# Patient Record
Sex: Female | Born: 1980 | Race: White | Hispanic: No | Marital: Married | Smoking: Never smoker
Health system: Southern US, Community
[De-identification: ages and names within clinical notes are randomized; demographics above are authoritative.]

## PROBLEM LIST (undated history)

## (undated) ENCOUNTER — Inpatient Hospital Stay (HOSPITAL_COMMUNITY): Payer: Self-pay

## (undated) DIAGNOSIS — R7303 Prediabetes: Secondary | ICD-10-CM

## (undated) DIAGNOSIS — F329 Major depressive disorder, single episode, unspecified: Secondary | ICD-10-CM

## (undated) DIAGNOSIS — K760 Fatty (change of) liver, not elsewhere classified: Secondary | ICD-10-CM

## (undated) DIAGNOSIS — R030 Elevated blood-pressure reading, without diagnosis of hypertension: Secondary | ICD-10-CM

## (undated) DIAGNOSIS — I10 Essential (primary) hypertension: Secondary | ICD-10-CM

## (undated) DIAGNOSIS — O24419 Gestational diabetes mellitus in pregnancy, unspecified control: Secondary | ICD-10-CM

## (undated) DIAGNOSIS — R12 Heartburn: Secondary | ICD-10-CM

## (undated) DIAGNOSIS — R0602 Shortness of breath: Secondary | ICD-10-CM

## (undated) DIAGNOSIS — F32A Depression, unspecified: Secondary | ICD-10-CM

## (undated) DIAGNOSIS — F419 Anxiety disorder, unspecified: Secondary | ICD-10-CM

## (undated) HISTORY — DX: Major depressive disorder, single episode, unspecified: F32.9

## (undated) HISTORY — DX: Shortness of breath: R06.02

## (undated) HISTORY — PX: WISDOM TOOTH EXTRACTION: SHX21

## (undated) HISTORY — DX: Elevated blood-pressure reading, without diagnosis of hypertension: R03.0

## (undated) HISTORY — DX: Essential (primary) hypertension: I10

## (undated) HISTORY — DX: Anxiety disorder, unspecified: F41.9

## (undated) HISTORY — DX: Fatty (change of) liver, not elsewhere classified: K76.0

## (undated) HISTORY — DX: Gestational diabetes mellitus in pregnancy, unspecified control: O24.419

## (undated) HISTORY — DX: Depression, unspecified: F32.A

## (undated) HISTORY — DX: Heartburn: R12

## (undated) HISTORY — DX: Prediabetes: R73.03

---

## 2004-01-09 ENCOUNTER — Other Ambulatory Visit: Admission: RE | Admit: 2004-01-09 | Discharge: 2004-01-09 | Payer: Self-pay | Admitting: Obstetrics and Gynecology

## 2005-01-23 ENCOUNTER — Other Ambulatory Visit: Admission: RE | Admit: 2005-01-23 | Discharge: 2005-01-23 | Payer: Self-pay | Admitting: Obstetrics and Gynecology

## 2005-04-18 ENCOUNTER — Emergency Department (HOSPITAL_COMMUNITY): Admission: EM | Admit: 2005-04-18 | Discharge: 2005-04-18 | Payer: Self-pay | Admitting: Emergency Medicine

## 2011-03-25 ENCOUNTER — Encounter (HOSPITAL_COMMUNITY): Payer: Self-pay | Admitting: *Deleted

## 2011-03-25 ENCOUNTER — Inpatient Hospital Stay (HOSPITAL_COMMUNITY)
Admission: AD | Admit: 2011-03-25 | Discharge: 2011-03-28 | DRG: 775 | Disposition: A | Discharge status: Home / Self Care | Payer: 59 | Source: Ambulatory Visit | Attending: Obstetrics & Gynecology | Admitting: Obstetrics & Gynecology

## 2011-03-25 DIAGNOSIS — Z22330 Carrier of Group B streptococcus: Secondary | ICD-10-CM

## 2011-03-25 DIAGNOSIS — O99892 Other specified diseases and conditions complicating childbirth: Secondary | ICD-10-CM | POA: Diagnosis present

## 2011-03-25 LAB — CBC
HCT: 33.9 % — ABNORMAL LOW (ref 36.0–46.0)
Hemoglobin: 11.3 g/dL — ABNORMAL LOW (ref 12.0–15.0)
MCH: 25.5 pg — ABNORMAL LOW (ref 26.0–34.0)
MCHC: 33.3 g/dL (ref 30.0–36.0)

## 2011-03-26 LAB — CBC
HCT: 32.3 % — ABNORMAL LOW (ref 36.0–46.0)
Hemoglobin: 10.6 g/dL — ABNORMAL LOW (ref 12.0–15.0)
MCHC: 32.8 g/dL (ref 30.0–36.0)
MCV: 76.4 fL — ABNORMAL LOW (ref 78.0–100.0)
RDW: 14.6 % (ref 11.5–15.5)

## 2011-03-26 LAB — COMPREHENSIVE METABOLIC PANEL
Albumin: 2.3 g/dL — ABNORMAL LOW (ref 3.5–5.2)
BUN: 6 mg/dL (ref 6–23)
Chloride: 101 mEq/L (ref 96–112)
Creatinine, Ser: 0.61 mg/dL (ref 0.50–1.10)
GFR calc Af Amer: >60 mL/min (ref >60)
Glucose, Bld: 84 mg/dL (ref 70–99)
Total Bilirubin: 0.2 mg/dL — ABNORMAL LOW (ref 0.3–1.2)
Total Protein: 5.5 g/dL — ABNORMAL LOW (ref 6.0–8.3)

## 2011-03-26 LAB — URIC ACID: Uric Acid, Serum: 4.9 mg/dL (ref 2.4–7.0)

## 2011-03-26 LAB — LACTATE DEHYDROGENASE: LDH: 134 U/L (ref 94–250)

## 2011-03-27 LAB — CBC
MCH: 25.4 pg — ABNORMAL LOW (ref 26.0–34.0)
MCV: 76.9 fL — ABNORMAL LOW (ref 78.0–100.0)
Platelets: 196 10*3/uL (ref 150–400)
RBC: 3.89 MIL/uL (ref 3.87–5.11)
RDW: 14.8 % (ref 11.5–15.5)

## 2011-03-28 LAB — RH IMMUNE GLOB WKUP(>/=20WKS)(NOT WOMEN'S HOSP)

## 2011-03-30 ENCOUNTER — Inpatient Hospital Stay (HOSPITAL_COMMUNITY): Admission: AD | Admit: 2011-03-30 | Payer: Self-pay | Source: Ambulatory Visit | Admitting: Obstetrics and Gynecology

## 2013-03-23 ENCOUNTER — Ambulatory Visit: Payer: Self-pay | Admitting: Family Medicine

## 2013-11-25 ENCOUNTER — Ambulatory Visit (INDEPENDENT_AMBULATORY_CARE_PROVIDER_SITE_OTHER): Payer: BC Managed Care – PPO | Admitting: Family Medicine

## 2013-11-25 ENCOUNTER — Encounter: Payer: Self-pay | Admitting: Family Medicine

## 2013-11-25 VITALS — BP 118/78 | HR 115 | Temp 98.5°F | Ht 67.5 in | Wt 247.2 lb

## 2013-11-25 DIAGNOSIS — R112 Nausea with vomiting, unspecified: Secondary | ICD-10-CM

## 2013-11-25 DIAGNOSIS — J069 Acute upper respiratory infection, unspecified: Secondary | ICD-10-CM

## 2013-11-25 MED ORDER — HYDROCOD POLST-CHLORPHEN POLST 10-8 MG/5ML PO LQCR: 5.0000 mL | Freq: Every evening | ORAL | Status: DC | PRN

## 2013-11-25 NOTE — Assessment & Plan Note (Signed)
Likely related to URI. No further tx needed.

## 2013-11-25 NOTE — Patient Instructions (Signed)
This is likely a virus. Keep taking Mucinex, drink plenty of fluids.  Try tussionex as needed for cough at night- it will make you sleepy.   Treat sympotmatically with Mucinex, nasal saline irrigation, and Tylenol/Ibuprofen.     Call if not improving as expected in 5-7 days.

## 2013-11-25 NOTE — Assessment & Plan Note (Signed)
Likely viral. Exam benign and reassuring- advised supportive care. Rx given for tussionex prn cough at night. Call or return to clinic prn if these symptoms worsen or fail to improve as anticipated.

## 2013-11-25 NOTE — Progress Notes (Signed)
Pre visit review using our clinic review tool, if applicable. No additional management support is needed unless otherwise documented below in the visit note. 

## 2013-11-25 NOTE — Progress Notes (Signed)
   Subjective:   Patient ID: Misty Brown, female    DOB: 03/11/1981, 33 y.o.   MRN: [id number]  Misty Brown is a pleasant 33 y.o. year old female who has never been seen here,  presents to clinic today with Generalized Body Aches and congestion in chest  on 11/25/2013  HPI: Since yesterday, productive cough, body aches, no fever. Nausea, gags when she coughs of sputum but no vomiting.  No diarrhea but did have gastroenteritis 2 weeks ago.  No sore throat except when she coughs. No CP.  She is not a smoker.  No known sick contacts.  Patient Active Problem List   Diagnosis Date Noted  . Acute upper respiratory infections of unspecified site 11/25/2013  . Nausea with vomiting 11/25/2013   No past medical history on file. No past surgical history on file. History  Substance Use Topics  . Smoking status: Never Smoker   . Smokeless tobacco: Not on file  . Alcohol Use: No   No family history on file. Allergies  Allergen Reactions  . Dicyclomine     Abdominal pain    No current outpatient prescriptions on file prior to visit.   No current facility-administered medications on file prior to visit.   The PMH, PSH, Social History, Family History, Medications, and allergies [facility], and have been updated if relevant.   Review of Systems  Constitutional: Positive for chills. Negative for fever, activity change, appetite change and unexpected weight change.  HENT: Negative for ear pain, mouth sores, sinus pressure and trouble swallowing.   Respiratory: Positive for cough. Negative for chest tightness, shortness of breath and wheezing.   Cardiovascular: Negative for chest pain, palpitations and leg swelling.  Gastrointestinal: Negative for diarrhea.  All other systems reviewed and are negative.       Objective:    BP 118/78  Pulse 115  Temp(Src) 98.5 F (36.9 C) (Oral)  Ht 5' 7.5" (1.715 m)  Wt 247 lb 4 oz (112.152 kg)  BMI 38.13 kg/m2  SpO2 97%   LMP 11/06/2013  Breastfeeding? Unknown   Physical Exam  Nursing note and vitals reviewed. Constitutional: She appears well-developed and well-nourished. No distress.  HENT:  Head: Normocephalic.  Right Ear: No drainage. Tympanic membrane is not injected and not bulging. Tympanic membrane mobility is normal.  Left Ear: There is drainage. Tympanic membrane is injected. Tympanic membrane is not bulging. Tympanic membrane mobility is normal.  Nose: Rhinorrhea present. No mucosal edema.  Mouth/Throat: No oropharyngeal exudate, posterior oropharyngeal edema, posterior oropharyngeal erythema or tonsillar abscesses.  Cardiovascular: Normal rate and regular rhythm.   Pulmonary/Chest: Effort normal and breath sounds normal.  Skin: Skin is warm, dry and intact. No rash noted.          Assessment & Plan:   Acute upper respiratory infections of unspecified site  Nausea with vomiting No Follow-up on file.

## 2013-12-08 ENCOUNTER — Ambulatory Visit: Payer: 59 | Admitting: Family Medicine

## 2014-01-26 ENCOUNTER — Ambulatory Visit: Payer: 59 | Admitting: Family Medicine

## 2014-02-09 ENCOUNTER — Ambulatory Visit (INDEPENDENT_AMBULATORY_CARE_PROVIDER_SITE_OTHER): Payer: BC Managed Care – PPO | Admitting: Family Medicine

## 2014-02-09 ENCOUNTER — Encounter: Payer: Self-pay | Admitting: Family Medicine

## 2014-02-09 VITALS — BP 126/80 | HR 73 | Temp 98.0°F | Ht 67.75 in | Wt 247.8 lb

## 2014-02-09 DIAGNOSIS — R110 Nausea: Secondary | ICD-10-CM

## 2014-02-09 DIAGNOSIS — O210 Mild hyperemesis gravidarum: Secondary | ICD-10-CM | POA: Insufficient documentation

## 2014-02-09 DIAGNOSIS — R29898 Other symptoms and signs involving the musculoskeletal system: Secondary | ICD-10-CM | POA: Insufficient documentation

## 2014-02-09 DIAGNOSIS — G609 Hereditary and idiopathic neuropathy, unspecified: Secondary | ICD-10-CM | POA: Insufficient documentation

## 2014-02-09 DIAGNOSIS — H538 Other visual disturbances: Secondary | ICD-10-CM

## 2014-02-09 HISTORY — DX: Other symptoms and signs involving the musculoskeletal system: R29.898

## 2014-02-09 LAB — CBC WITH DIFFERENTIAL/PLATELET
BASOS ABS: 0 10*3/uL (ref 0.0–0.1)
Basophils Relative: 0.5 % (ref 0.0–3.0)
EOS ABS: 0.3 10*3/uL (ref 0.0–0.7)
Eosinophils Relative: 3.1 % (ref 0.0–5.0)
HCT: 39.1 % (ref 36.0–46.0)
Hemoglobin: 12.6 g/dL (ref 12.0–15.0)
LYMPHS PCT: 19.1 % (ref 12.0–46.0)
Lymphs Abs: 1.9 10*3/uL (ref 0.7–4.0)
MCHC: 32.3 g/dL (ref 30.0–36.0)
MCV: 78.6 fl (ref 78.0–100.0)
Monocytes Absolute: 0.5 10*3/uL (ref 0.1–1.0)
Monocytes Relative: 5.6 % (ref 3.0–12.0)
NEUTROS PCT: 71.7 % (ref 43.0–77.0)
Neutro Abs: 7 10*3/uL (ref 1.4–7.7)
PLATELETS: 289 10*3/uL (ref 150.0–400.0)
RBC: 4.97 Mil/uL (ref 3.87–5.11)
RDW: 14.1 % (ref 11.5–15.5)
WBC: 9.7 10*3/uL (ref 4.0–10.5)

## 2014-02-09 LAB — COMPREHENSIVE METABOLIC PANEL
ALBUMIN: 3.8 g/dL (ref 3.5–5.2)
ALK PHOS: 62 U/L (ref 39–117)
ALT: 21 U/L (ref 0–35)
AST: 22 U/L (ref 0–37)
BUN: 9 mg/dL (ref 6–23)
CO2: 27 mEq/L (ref 19–32)
Calcium: 9.6 mg/dL (ref 8.4–10.5)
Chloride: 106 mEq/L (ref 96–112)
Creatinine, Ser: 0.7 mg/dL (ref 0.4–1.2)
GFR: 108.24 mL/min (ref >60.00)
Glucose, Bld: 85 mg/dL (ref 70–99)
POTASSIUM: 4.2 meq/L (ref 3.5–5.1)
SODIUM: 138 meq/L (ref 135–145)
TOTAL PROTEIN: 7.6 g/dL (ref 6.0–8.3)
Total Bilirubin: 0.4 mg/dL (ref 0.2–1.2)

## 2014-02-09 LAB — HEMOGLOBIN A1C: Hgb A1c MFr Bld: 5.7 % (ref 4.6–6.5)

## 2014-02-09 LAB — T4, FREE: FREE T4: 0.71 ng/dL (ref 0.60–1.60)

## 2014-02-09 LAB — C-REACTIVE PROTEIN: CRP: 2.3 mg/dL (ref 0.5–20.0)

## 2014-02-09 LAB — TSH: TSH: 1.04 u[IU]/mL (ref 0.35–4.50)

## 2014-02-09 LAB — VITAMIN B12: Vitamin B-12: 234 pg/mL (ref 211–911)

## 2014-02-09 LAB — SEDIMENTATION RATE: SED RATE: 8 mm/h (ref 0–22)

## 2014-02-09 NOTE — Progress Notes (Signed)
Pre visit review using our clinic review tool, if applicable. No additional management support is needed unless otherwise documented below in the visit note. 

## 2014-02-09 NOTE — Assessment & Plan Note (Addendum)
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 If neg, consider brain MRI. Neuro exam reassuring.  I did advise her to make an eye doctors appt. Orders Placed This Encounter  Procedures  . CBC with Differential  . Hemoglobin A1c  . Comprehensive metabolic panel  . TSH  . T4, Free  . Vitamin B12  . Vitamin D, 25-hydroxy  . Sedimentation Rate  . C-reactive protein  . Myasthenia gravis panel 2

## 2014-02-09 NOTE — Progress Notes (Signed)
   Subjective:   Patient ID: Misty MamChristy Hamelin, female    DOB: 09/28/1981, 33 y.o.   MRN: [id number]  Misty Brown is a pleasant 33 y.o. year old female who presents to clinic today with Establish Care and Extremity Weakness  on 02/09/2014  HPI: Intermittent episodes for over a year of intermittent LE weakness and tingling.  Often associated with nausea, no vomiting.  Not usually presyncopal but does feel hot. Symptoms usually resolve within 30 minutes.  Something to eat usually helps although fasting (like for labs) does not seem to bring about these attacks.  Denies faituge. Denies HA. +intermitent blurred vision, not like tunnel vision or double vision- this is constant but only started after these other symptoms started.   All symptoms worse at night.    +FH of DM but not first degree relatives.  Patient Active Problem List   Diagnosis Date Noted  . Lower extremity weakness 02/09/2014  . Nauseated 02/09/2014   History reviewed. No pertinent past medical history. Past Surgical History  Procedure Laterality Date  . Wisdom tooth extraction     History  Substance Use Topics  . Smoking status: Never Smoker   . Smokeless tobacco: Never Used  . Alcohol Use: No   Family History  Problem Relation Age of Onset  . Hypertension Mother   . Heart disease Maternal Grandmother   . Heart disease Maternal Grandfather   . Stroke Paternal Grandmother    Allergies  Allergen Reactions  . Dicyclomine     Abdominal pain    Current Outpatient Prescriptions on File Prior to Visit  Medication Sig Dispense Refill  . norgestimate-ethinyl estradiol (ORTHO-CYCLEN,SPRINTEC,PREVIFEM) 0.25-35 MG-MCG tablet Take 1 tablet by mouth daily.       No current facility-administered medications on file prior to visit.   The PMH, PSH, Social History, Family History, Medications, and allergies [facility], and have been updated if relevant.  Review of Systems    See HPI No UE weakness or  tingling No CP or SOB No HA No increased frequency of urination No back pain  Objective:    BP 126/80  Pulse 73  Temp(Src) 98 F (36.7 C) (Oral)  Ht 5' 7.75" (1.721 m)  Wt 247 lb 12 oz (112.379 kg)  BMI 37.94 kg/m2  SpO2 97%  LMP 01/13/2014  Breastfeeding? No   Physical Exam  Nursing note and vitals reviewed. Constitutional: She appears well-developed and well-nourished. No distress.  HENT:  Head: Normocephalic and atraumatic.  Eyes: Pupils are equal, round, and reactive to light.  Neck: Normal range of motion.  Cardiovascular: Normal rate and regular rhythm.   Abdominal: Soft. Bowel sounds are normal.  Neurological: She has normal strength and normal reflexes. No cranial nerve deficit or sensory deficit. She displays a negative Romberg sign.  Skin: Skin is warm and dry.  Psychiatric: She has a normal mood and affect. Her behavior is normal. Judgment and thought content normal.          Assessment & Plan:   Lower extremity weakness - Plan: CBC with Differential, Hemoglobin A1c, Comprehensive metabolic panel, TSH, T4, Free, Vitamin B12, Vitamin D, 25-hydroxy, Sedimentation Rate, C-reactive protein  Nauseated - Plan: CBC with Differential, Hemoglobin A1c No Follow-up on file.

## 2014-02-09 NOTE — Patient Instructions (Signed)
Good to see you. I will call you with your lab results.  Please make an appointment with an eye doctor.

## 2014-02-10 LAB — VITAMIN D 25 HYDROXY (VIT D DEFICIENCY, FRACTURES): Vit D, 25-Hydroxy: 30 ng/mL (ref 30–89)

## 2014-02-16 LAB — MYASTHENIA GRAVIS PANEL 2
ACETYLCHOLINE REC MOD AB: 3 %
Acetylcholine Rec Binding: <0.3 nmol/L
Aceytlcholine Rec Bloc Ab: <15 % of inhibition (ref <15)

## 2014-04-07 ENCOUNTER — Telehealth: Payer: Self-pay

## 2014-04-07 NOTE — Telephone Encounter (Signed)
Pt started on oral OTC B 12 in May 2015 and pt said it made her feel tired and irritable and had stomach pain. Pt stopped med and pt felt fine, no fatigue and no irritabilty. Pt wants to know if she should try B 12 injectable. [facility] Pt request cb. Please see result note 02/17/14.

## 2014-04-10 NOTE — Telephone Encounter (Signed)
Per Rose (front office) the medication is $5 and the administration fee is $60. Patient notified as instructed by telephone. Was advised by patient that she does not want to take the injections at this time, but will call back if she changes her mind.

## 2014-04-10 NOTE — Telephone Encounter (Signed)
Yes I would suggest IM B12 injections but it is not absolutely necessary.  B12 low end of normal and I felt she may feel less fatigued if we replete it.  I cannot comment on cost.  Please ask Carlena SaxBlair about cost of this injection.

## 2014-07-31 ENCOUNTER — Encounter: Payer: Self-pay | Admitting: Family Medicine

## 2014-09-29 NOTE — L&D Delivery Note (Signed)
Delivery Note At 4:27 PM a viable and healthy female was delivered via Vaginal, Spontaneous Delivery (Presentation:Right ; Occiput Anterior).  APGAR: 9, 9; weight pending .   Placenta status: Intact, Spontaneous.  Cord: 3 vessels with a loose nuchal cord x 1 reduced on the perineum.   Anesthesia: Epidural  Episiotomy: None Lacerations: 1st degree;Vaginal Suture Repair: 3.0 vicryl Est. Blood Loss (mL): 190  Mom to postpartum.  Baby to Couplet care / Skin to Skin.  Misty Mateo H. 07/23/2015, 4:53 PM

## 2014-11-29 ENCOUNTER — Telehealth: Payer: Self-pay | Admitting: Family Medicine

## 2014-11-29 NOTE — Telephone Encounter (Signed)
Pt has appt with Dr Reece AgarG on 12/01/14 at 3:45pm.

## 2014-11-29 NOTE — Telephone Encounter (Signed)
[facility] - Client TELEPHONE ADVICE RECORD [facility] Patient Name: Misty Brown DOB: 01/10/1981 Initial Comment Caller states her ankles have been swelling up for the past 3 days. Caller states she does have an appt on Friday but wants to know what she can do until then Nurse Assessment Nurse: Apolinar JunesBrandon, RN, Darl PikesSusan Date/Time Lamount Cohen(Eastern Time): 11/29/2014 12:39:04 PM Confirm and document reason for call. If symptomatic, describe symptoms. ---Caller states her ankles have been swelling up for the past 3 days. Caller states she does have an appt on Friday but wants to know what she can do until then - not severe but noticeable , her sock will leave indention - she had it during her pregnancy - it does get better when she gets home and is able to elevate her feet and legs - when she waken in the morning they are fine - she does drink a lot of soft drinks and it trying to decrease drinking them and it drinking a lot of crystal light and she is urinating a lot more , but she does not drink much water at all - states there is a possibility she may be pregnant she just realized she has not started her period and she should have a week ago Has the patient traveled out of the country within the last 30 days? ---Not Applicable Does the patient require triage? ---Yes Related visit to physician within the last 2 weeks? ---No Does the PT have any chronic conditions? (i.e. diabetes, asthma, etc.) ---No Did the patient indicate they were pregnant? ---No Guidelines Guideline Title Affirmed Question Affirmed Notes Leg Swelling and Edema [1] MILD swelling of both ankles (i.e., pedal edema) AND [2] new onset or worsening Final Disposition User See PCP When Office is Open (within 3 days) Apolinar JunesBrandon, Charity fundraiserN, Darl PikesSusan Comments caller states she already has an appointment scheduled for Friday December 01, 2014 at 3:30 Advised caller if there is a possibility she may be  pregnant she can go ahead and do over the counter pregnancy test and then if needed blood work can possibly be done at office if needed - advised her to speak with her PCP about that

## 2014-12-01 ENCOUNTER — Ambulatory Visit: Payer: Self-pay | Admitting: Family Medicine

## 2014-12-28 LAB — OB RESULTS CONSOLE RUBELLA ANTIBODY, IGM: RUBELLA: IMMUNE

## 2014-12-28 LAB — OB RESULTS CONSOLE GC/CHLAMYDIA
CHLAMYDIA, DNA PROBE: NEGATIVE
Gonorrhea: NEGATIVE

## 2014-12-28 LAB — OB RESULTS CONSOLE HEPATITIS B SURFACE ANTIGEN: HEP B S AG: NEGATIVE

## 2014-12-28 LAB — OB RESULTS CONSOLE HIV ANTIBODY (ROUTINE TESTING): HIV: NONREACTIVE

## 2014-12-28 LAB — OB RESULTS CONSOLE RPR: RPR: NONREACTIVE

## 2015-01-17 ENCOUNTER — Other Ambulatory Visit (HOSPITAL_COMMUNITY): Payer: Self-pay | Admitting: Obstetrics & Gynecology

## 2015-01-17 DIAGNOSIS — Z3A18 18 weeks gestation of pregnancy: Secondary | ICD-10-CM

## 2015-01-17 DIAGNOSIS — O289 Unspecified abnormal findings on antenatal screening of mother: Secondary | ICD-10-CM

## 2015-01-17 DIAGNOSIS — Z3689 Encounter for other specified antenatal screening: Secondary | ICD-10-CM

## 2015-02-22 ENCOUNTER — Other Ambulatory Visit (HOSPITAL_COMMUNITY): Payer: Self-pay | Admitting: Obstetrics & Gynecology

## 2015-02-22 ENCOUNTER — Ambulatory Visit (HOSPITAL_COMMUNITY)
Admission: RE | Admit: 2015-02-22 | Discharge: 2015-02-22 | Disposition: A | Discharge status: Home / Self Care | Payer: BLUE CROSS/BLUE SHIELD | Source: Ambulatory Visit | Attending: Obstetrics & Gynecology | Admitting: Obstetrics & Gynecology

## 2015-02-22 ENCOUNTER — Other Ambulatory Visit (HOSPITAL_COMMUNITY): Payer: Self-pay | Admitting: Obstetrics and Gynecology

## 2015-02-22 ENCOUNTER — Encounter (HOSPITAL_COMMUNITY): Payer: Self-pay

## 2015-02-22 DIAGNOSIS — Z3A18 18 weeks gestation of pregnancy: Secondary | ICD-10-CM | POA: Diagnosis not present

## 2015-02-22 DIAGNOSIS — O358XX0 Maternal care for other (suspected) fetal abnormality and damage, not applicable or unspecified: Secondary | ICD-10-CM

## 2015-02-22 DIAGNOSIS — O285 Abnormal chromosomal and genetic finding on antenatal screening of mother: Secondary | ICD-10-CM | POA: Insufficient documentation

## 2015-02-22 DIAGNOSIS — Z36 Encounter for antenatal screening of mother: Secondary | ICD-10-CM | POA: Diagnosis not present

## 2015-02-22 DIAGNOSIS — O280 Abnormal hematological finding on antenatal screening of mother: Secondary | ICD-10-CM | POA: Insufficient documentation

## 2015-02-22 DIAGNOSIS — O289 Unspecified abnormal findings on antenatal screening of mother: Secondary | ICD-10-CM

## 2015-02-22 DIAGNOSIS — Z3689 Encounter for other specified antenatal screening: Secondary | ICD-10-CM

## 2015-02-22 DIAGNOSIS — O35DXX0 Maternal care for other (suspected) fetal abnormality and damage, fetal gastrointestinal anomalies, not applicable or unspecified: Secondary | ICD-10-CM

## 2015-02-22 NOTE — Progress Notes (Signed)
Genetic Counseling  High-Risk Gestation Note  Appointment Date:  02/22/2015 Referred By: Essie Hart, MD Date of Birth:  [birth year] Partner: Misty Brown   Pregnancy History: [id number] Estimated Date of Delivery: 07/26/15 Estimated Gestational Age: [redacted]w[redacted]d Attending: Eulis Foster, MD   [name] and her husband, Mr. Misty Brown, were seen for genetic counseling because of an increased risk for fetal Down syndrome based on first trimester screening.  In summary:  Discussed 1:224 Down syndrome risk from First screen  Ultrasound today visualized echogenic bowel and increased nuchal fold, increasing risk for Down syndrome to approximately 1 in 22  Patient elected to proceed with NIPS (Panorama) and TORCH titers, declined cystic fibrosis carrier screen and amniocentesis  Follow-up ultrasound scheduled for 03/15/15  They were counseled regarding the First trimester screen result and the associated 1 in 224 risk for fetal Down syndrome.  We reviewed chromosomes, nondisjunction, and the common features and variable prognosis of Down syndrome.  In addition, we reviewed the screen adjusted reduction in risks for trisomy 18 (< 1 in 5000).  We also discussed other explanations for a screen positive result including: differences in maternal metabolism and normal variation. They understand that this screening is not diagnostic for Down syndrome but provides a risk assessment.  We reviewed results of detailed ultrasound performed at the time of today's visit. Ultrasound today visualized fetal echogenic bowel and increased nuchal fold measurement (~7 mm). Complete ultrasound results reported separately. The nuchal fold refers to the skin on the back of the fetal neck. A thick nuchal fold measurement is typically defined as greater than 5 mm at 15-[redacted] weeks gestation and greater than 6 mm at 18 through [redacted] weeks gestation. Approximately 1-2% of normal pregnancies will have a thick nuchal fold;  therefore, most babies with this finding are born normal and healthy.  However, the presence of a thick nuchal fold increases the chance for Down syndrome in a pregnancy.   Echogenic bowel was identified at the time of today's ultrasound. The ultrasound report will be sent under separate cover. We discussed that an echogenic bowel refers to an area in the fetal intestines that appears brighter, or denser, than the liver and/or bone on ultrasound. Echogenic bowel is detected in approximately 1% of fetuses at the time of a second trimester ultrasound evaluation.  Most of the time, this brightness does not cause any problems and will spontaneously resolve.  Possible causes of an echogenic bowel include undergrowth of the bowel, an obstruction or blockage of the intestines, the fetus swallowing a small amount of blood present in the amniotic fluid, an intrauterine infection, a chromosome condition, cystic fibrosis, or a normal variation in development.   We discussed that the presence of fetal echogenic bowel and increased nuchal fold would increase the risk for fetal Down syndrome from her screening risk of 1 in 224 to approximately 1 in 22 (4.5%).    [name] reported bleeding early in the first trimester in the pregnancy but no bleeding since that time. She also denied known exposure to infections during the pregnancy. We briefly reviewed cystic fibrosis (CF) including the typical features and prognosis and the autosomal recessive inheritance of the condition. The carrier frequency in the Caucasian population is approximately 1 in 8.  The presence of echogenic bowel on prenatal ultrasound is associated with an increased risk for CF.  They were provided with written information regarding cystic fibrosis (CF) including the carrier frequency and incidence in the Caucasian population, the availability of  carrier testing and prenatal diagnosis if indicated.  In addition, we discussed that CF is routinely screened  for as part of the [place] newborn screening panel.  She declined CF testing today.   We reviewed available screening options including noninvasive prenatal screening (NIPS)/cell free DNA (cfDNA) testing.  They were counseled that screening tests are used to modify a patient's a priori risk for aneuploidy, typically based on age. This estimate provides a pregnancy specific risk assessment. We reviewed the benefits and limitations of each option. Specifically, we discussed the conditions for which each test screens, the detection rates, and false positive rates of each. They were also counseled regarding diagnostic testing via amniocentesis. We reviewed the approximate 1 in 300-500 risk for complications for amniocentesis, including spontaneous pregnancy loss. We discussed the risks, limitations, and benefits of each. After consideration of all the options,  she elected to proceed with cell free DNA testing (Panorama) at the time of today's visit and elected to proceed with maternal TORCH titers. The patient declined amniocentesis at this time. She understands that ultrasound and screening cannot rule out all birth defects or genetic syndromes. A follow-up ultrasound was planned in 3 weeks.   Both family histories were reviewed and found to be noncontributory for birth defects, intellectual disability, and known genetic conditions. Without further information regarding the provided family history, an accurate genetic risk cannot be calculated. Further genetic counseling is warranted if more information is obtained.  [name] denied exposure to environmental toxins or chemical agents. She denied the use of alcohol, tobacco or street drugs. She denied significant viral illnesses during the course of her pregnancy. Her medical and surgical histories were noncontributory.   I counseled this couple for approximately 40 minutes regarding the above risks and available options.   Misty PlowmanKaren Merriel Zinger, MS,   Certified Genetic Counselor 02/22/2015

## 2015-02-23 LAB — TOXOPLASMA ANTIBODIES- IGG AND  IGM: Toxoplasma IgG Ratio: <3 IU/mL (ref 0.0–7.1)

## 2015-02-23 LAB — CMV ANTIBODY, IGG (EIA): CMV Ab - IgG: <0.6 U/mL (ref 0.00–0.59)

## 2015-02-23 LAB — CMV IGM: CMV IgM: <30 AU/mL (ref 0.0–29.9)

## 2015-02-27 ENCOUNTER — Telehealth (HOSPITAL_COMMUNITY): Payer: Self-pay | Admitting: *Deleted

## 2015-02-27 NOTE — Telephone Encounter (Signed)
Called Misty Brown with the results of her CMV and Toxo titers.  These were reviewed with Dr. Claudean SeveranceWhitecar and found to be wnl.  Pt verbalized understanding.  Informed patient that her Panorama had not come back yet, but our genetic counselor will call her as soon as those results are available.

## 2015-03-05 ENCOUNTER — Telehealth (HOSPITAL_COMMUNITY): Payer: Self-pay | Admitting: MS"

## 2015-03-05 ENCOUNTER — Other Ambulatory Visit (HOSPITAL_COMMUNITY): Payer: Self-pay | Admitting: Obstetrics & Gynecology

## 2015-03-05 NOTE — Telephone Encounter (Signed)
Called Misty Brown to discuss her cell free fetal DNA test results.  Mrs. Misty Mamhristy Harpham had [facility]ama testing [facility].  Testing was offered because of increased Down syndrome risk from serum screening and ultrasound findings.   The patient was identified by name and DOB.  We reviewed that these are within normal limits, showing a less than 1 in 10,000 risk for trisomies 21, 18 and 13, and monosomy X (Turner syndrome).  In addition, the risk for triploidy/vanishing twin and sex chromosome trisomies (47,XXX and 47,XXY) was also low risk. We reviewed that this testing identifies > 99% of pregnancies with trisomy 221, trisomy 5213, sex chromosome trisomies (47,XXX and 47,XXY), and triploidy. The detection rate for trisomy 18 is 96%.  The detection rate for monosomy X is ~92%.  The false positive rate is <0.1% for all conditions. Testing was also consistent with female fetal sex.  The patient did wish to know fetal sex.  She understands that this testing does not identify all genetic conditions.  All questions were answered to her satisfaction, she was encouraged to call with additional questions or concerns.  Quinn PlowmanKaren Galo Sayed, MS Certified Genetic Counselor 03/05/2015 9:04 AM

## 2015-03-07 ENCOUNTER — Telehealth: Payer: Self-pay | Admitting: Family Medicine

## 2015-03-07 NOTE — Telephone Encounter (Signed)
Pt has appt scheduled 03/08/15 at 10:30 AM with Nicki Reaperegina Baity NP.

## 2015-03-07 NOTE — Telephone Encounter (Signed)
Patient Name: Misty Brown DOB: 06/07/1981 Initial Comment Caller states she has sinus pressure and has green discharge. Is [redacted] weeks pregnant. Nurse Assessment Nurse: Elijah Birkaldwell, RN, Lynda Date/Time (Eastern Time): 03/07/2015 4:39:46 PM Confirm and document reason for call. If symptomatic, describe symptoms. ---Caller states she has had sinus pressure and has green discharge since May 28th. Has cough, brings up brownish phlegm, sometimes feels SOB. She is [redacted] weeks pregnant. No fever. Has been taking Claritin. Her top teeth hurt as well. Has the patient traveled out of the country within the last 30 days? ---Not Applicable Does the patient require triage? ---Yes Related visit to physician within the last 2 weeks? ---No Does the PT have any chronic conditions? (i.e. diabetes, asthma, etc.) ---No Did the patient indicate they were pregnant? ---Yes How many weeks gestation? ---20 Have you felt decreased fetal movement? ---No Guidelines Guideline Title Affirmed Question Affirmed Notes Sinus Pain or Congestion [1] Sinus congestion (pressure, fullness) AND [2] present > 10 days Final Disposition User See PCP When Office is Open (within 3 days) Elijah Birkaldwell, Charity fundraiserN, [employer]ys[employer]Lynda

## 2015-03-08 ENCOUNTER — Encounter: Payer: Self-pay | Admitting: Internal Medicine

## 2015-03-08 ENCOUNTER — Ambulatory Visit (INDEPENDENT_AMBULATORY_CARE_PROVIDER_SITE_OTHER): Payer: BLUE CROSS/BLUE SHIELD | Admitting: Internal Medicine

## 2015-03-08 VITALS — BP 126/72 | HR 116 | Temp 98.2°F | Wt 236.0 lb

## 2015-03-08 DIAGNOSIS — J0110 Acute frontal sinusitis, unspecified: Secondary | ICD-10-CM | POA: Diagnosis not present

## 2015-03-08 MED ORDER — AMOXICILLIN 500 MG PO CAPS: 500.0000 mg | ORAL_CAPSULE | Freq: Three times a day (TID) | ORAL | Status: DC

## 2015-03-08 NOTE — Progress Notes (Signed)
HPI  Pt presents to the clinic today with c/o facial pain, pressure, nasal congestion and cough. This started 1.5 weeks ago but symptoms seem to be getting worse. She is blowing green mucous out of her nose. The cough is non productive. She feels like she has run low grade fevers. She has also had some pain in her teeth. She has tried Claritin, Flonase and nasal saline rinse without relief. She has not history of allergies or breathing problems. She has not had sick contacts.  Review of Systems    Past Medical History  Diagnosis Date  . Medical history non-contributory     Family History  Problem Relation Age of Onset  . Hypertension Mother   . Heart disease Maternal Grandmother   . Heart disease Maternal Grandfather   . Stroke Paternal Grandmother     History   Social History  . Marital Status: Married    Spouse Name: N/A  . Number of Children: N/A  . Years of Education: N/A   Occupational History  . Not on file.   Social History Main Topics  . Smoking status: Never Smoker   . Smokeless tobacco: Never Used  . Alcohol Use: No  . Drug Use: No  . Sexual Activity: Yes   Other Topics Concern  . Not on file   Social History Narrative    Allergies  Allergen Reactions  . Dicyclomine     Abdominal pain      Constitutional: Positive headache, fatigue and fever. Denies abrupt weight changes.  HEENT:  Positive eye pain, pressure behind the eyes, facial pain, nasal congestion and sore throat. Denies eye redness, ear pain, ringing in the ears, wax buildup, runny nose or bloody nose. Respiratory: Positive cough. Denies difficulty breathing or shortness of breath.  Cardiovascular: Denies chest pain, chest tightness, palpitations or swelling in the hands or feet.   No other specific complaints in a complete review of systems (except as listed in HPI above).  Objective:  BP 126/72 mmHg  Pulse 116  Temp(Src) 98.2 F (36.8 C) (Oral)  Wt 236 lb (107.049 kg)  SpO2  98%   General: Appears her stated age, well developed, well nourished in NAD. HEENT: Head: normal shape and size, frontal sinus tenderness noted; Eyes: sclera white, no icterus, conjunctiva pink; Ears: Tm's gray and intact, normal light reflex; Nose: mucosa boggy and moist, septum midline; Throat/Mouth: + PND. Teeth present, mucosa pink and moist, no exudate noted, no lesions or ulcerations noted.  Neck: No adenopathy noted.  Cardiovascular: Normal rate and rhythm. S1,S2 noted.  No murmur, rubs or gallops noted.  Pulmonary/Chest: Normal effort and positive vesicular breath sounds. No respiratory distress. No wheezes, rales or ronchi noted.      Assessment & Plan:   Acute frontal sinusitis  Can use a Neti Pot which can be purchased from your local drug store. Continue Claritin and Flonase Amoxicillin TID for 10 days (reviewed pregnancy category B, known cleft lip defect, she understands and agrees to the risk)  RTC as needed or if symptoms persist.

## 2015-03-08 NOTE — Progress Notes (Signed)
Pre visit review using our clinic review tool, if applicable. No additional management support is needed unless otherwise documented below in the visit note. 

## 2015-03-08 NOTE — Patient Instructions (Signed)

## 2015-03-15 ENCOUNTER — Encounter (HOSPITAL_COMMUNITY): Payer: Self-pay

## 2015-03-15 ENCOUNTER — Ambulatory Visit (HOSPITAL_COMMUNITY)
Admission: RE | Admit: 2015-03-15 | Discharge: 2015-03-15 | Disposition: A | Discharge status: Home / Self Care | Payer: BLUE CROSS/BLUE SHIELD | Source: Ambulatory Visit | Attending: Obstetrics & Gynecology | Admitting: Obstetrics & Gynecology

## 2015-03-15 DIAGNOSIS — Z3A21 21 weeks gestation of pregnancy: Secondary | ICD-10-CM | POA: Insufficient documentation

## 2015-03-15 DIAGNOSIS — O4402 Placenta previa specified as without hemorrhage, second trimester: Secondary | ICD-10-CM | POA: Diagnosis not present

## 2015-03-15 DIAGNOSIS — Z0489 Encounter for examination and observation for other specified reasons: Secondary | ICD-10-CM | POA: Insufficient documentation

## 2015-03-15 DIAGNOSIS — O289 Unspecified abnormal findings on antenatal screening of mother: Secondary | ICD-10-CM

## 2015-03-15 DIAGNOSIS — Z36 Encounter for antenatal screening of mother: Secondary | ICD-10-CM | POA: Diagnosis not present

## 2015-03-15 DIAGNOSIS — O283 Abnormal ultrasonic finding on antenatal screening of mother: Secondary | ICD-10-CM | POA: Insufficient documentation

## 2015-03-15 DIAGNOSIS — O35DXX0 Maternal care for other (suspected) fetal abnormality and damage, fetal gastrointestinal anomalies, not applicable or unspecified: Secondary | ICD-10-CM | POA: Insufficient documentation

## 2015-03-15 DIAGNOSIS — IMO0002 Reserved for concepts with insufficient information to code with codable children: Secondary | ICD-10-CM | POA: Insufficient documentation

## 2015-03-15 DIAGNOSIS — O99212 Obesity complicating pregnancy, second trimester: Secondary | ICD-10-CM | POA: Insufficient documentation

## 2015-03-15 DIAGNOSIS — O358XX0 Maternal care for other (suspected) fetal abnormality and damage, not applicable or unspecified: Secondary | ICD-10-CM | POA: Diagnosis present

## 2015-05-23 ENCOUNTER — Encounter: Payer: BLUE CROSS/BLUE SHIELD | Attending: Obstetrics and Gynecology

## 2015-05-23 VITALS — Ht 69.0 in | Wt 245.1 lb

## 2015-05-23 DIAGNOSIS — O24419 Gestational diabetes mellitus in pregnancy, unspecified control: Secondary | ICD-10-CM | POA: Diagnosis present

## 2015-05-23 DIAGNOSIS — Z713 Dietary counseling and surveillance: Secondary | ICD-10-CM | POA: Insufficient documentation

## 2015-05-24 NOTE — Progress Notes (Signed)
  Patient was seen on 05/24/15 for Gestational Diabetes self-management . The following learning objectives were met by the patient :   States the definition of Gestational Diabetes  States why dietary management is important in controlling blood glucose  Describes the effects of carbohydrates on blood glucose levels  Demonstrates ability to create a balanced meal plan  Demonstrates carbohydrate counting   States when to check blood glucose levels  Demonstrates proper blood glucose monitoring techniques  States the effect of stress and exercise on blood glucose levels  States the importance of limiting caffeine and abstaining from alcohol and smoking  Plan:  Aim for 2 Carb Choices per meal (30 grams) +/- 1 either way for breakfast Aim for 3 Carb Choices per meal (45 grams) +/- 1 either way from lunch and dinner Aim for 1-2 Carbs per snack Begin reading food labels for Total Carbohydrate and sugar grams of foods Consider  increasing your activity level by walking daily as tolerated Begin checking BG before breakfast and 2 hours after first bit of breakfast, lunch and dinner after  as directed by MD  Take medication  as directed by MD  Blood glucose monitor given: One Touch Verio Flex Lot # [id number] Exp: 07/2016 Blood glucose reading: $RemoveBeforeDE'84mg'BlorjarHxcOodVn$ /dl  Patient instructed to monitor glucose levels: FBS: 60 - <90 2 hour: <120  Patient received the following handouts:  Nutrition Diabetes and Pregnancy  Carbohydrate Counting List  Meal Planning worksheet  Patient will be seen for follow-up as needed.

## 2015-06-29 LAB — OB RESULTS CONSOLE GBS: GBS: NEGATIVE

## 2015-07-23 ENCOUNTER — Inpatient Hospital Stay (HOSPITAL_COMMUNITY): Payer: BLUE CROSS/BLUE SHIELD | Admitting: Anesthesiology

## 2015-07-23 ENCOUNTER — Inpatient Hospital Stay (HOSPITAL_COMMUNITY)
Admission: AD | Admit: 2015-07-23 | Discharge: 2015-07-25 | DRG: 775 | Disposition: A | Discharge status: Home / Self Care | Payer: BLUE CROSS/BLUE SHIELD | Source: Ambulatory Visit | Attending: Obstetrics and Gynecology | Admitting: Obstetrics and Gynecology

## 2015-07-23 ENCOUNTER — Encounter (HOSPITAL_COMMUNITY): Payer: Self-pay | Admitting: *Deleted

## 2015-07-23 DIAGNOSIS — Z8249 Family history of ischemic heart disease and other diseases of the circulatory system: Secondary | ICD-10-CM | POA: Diagnosis not present

## 2015-07-23 DIAGNOSIS — O24410 Gestational diabetes mellitus in pregnancy, diet controlled: Secondary | ICD-10-CM | POA: Diagnosis present

## 2015-07-23 DIAGNOSIS — Z6834 Body mass index (BMI) 34.0-34.9, adult: Secondary | ICD-10-CM | POA: Diagnosis not present

## 2015-07-23 DIAGNOSIS — Z823 Family history of stroke: Secondary | ICD-10-CM

## 2015-07-23 DIAGNOSIS — O99214 Obesity complicating childbirth: Secondary | ICD-10-CM | POA: Diagnosis present

## 2015-07-23 DIAGNOSIS — O24419 Gestational diabetes mellitus in pregnancy, unspecified control: Secondary | ICD-10-CM | POA: Diagnosis present

## 2015-07-23 DIAGNOSIS — E669 Obesity, unspecified: Secondary | ICD-10-CM | POA: Diagnosis present

## 2015-07-23 DIAGNOSIS — Z3A39 39 weeks gestation of pregnancy: Secondary | ICD-10-CM

## 2015-07-23 HISTORY — DX: Gestational diabetes mellitus in pregnancy, unspecified control: O24.419

## 2015-07-23 LAB — CBC
HEMATOCRIT: 34.3 % — AB (ref 36.0–46.0)
HEMOGLOBIN: 11.2 g/dL — AB (ref 12.0–15.0)
MCH: 24.2 pg — AB (ref 26.0–34.0)
MCHC: 32.7 g/dL (ref 30.0–36.0)
MCV: 74.2 fL — AB (ref 78.0–100.0)
Platelets: 218 10*3/uL (ref 150–400)
RBC: 4.62 MIL/uL (ref 3.87–5.11)
RDW: 14.9 % (ref 11.5–15.5)
WBC: 7.8 10*3/uL (ref 4.0–10.5)

## 2015-07-23 LAB — TYPE AND SCREEN
ABO/RH(D): O NEG
Antibody Screen: NEGATIVE

## 2015-07-23 MED ORDER — OXYTOCIN 40 UNITS IN LACTATED RINGERS INFUSION - SIMPLE MED
1.0000 m[IU]/min | INTRAVENOUS | Status: DC
  Administered 2015-07-23: 2 m[IU]/min via INTRAVENOUS
  Filled 2015-07-23: qty 1000

## 2015-07-23 MED ORDER — LIDOCAINE HCL (PF) 1 % IJ SOLN
INTRAMUSCULAR | Status: DC | PRN
  Administered 2015-07-23 (×2): 4 mL

## 2015-07-23 MED ORDER — IBUPROFEN 600 MG PO TABS
600.0000 mg | ORAL_TABLET | Freq: Four times a day (QID) | ORAL | Status: DC
  Administered 2015-07-23 – 2015-07-25 (×6): 600 mg via ORAL
  Filled 2015-07-23 (×7): qty 1

## 2015-07-23 MED ORDER — OXYCODONE-ACETAMINOPHEN 5-325 MG PO TABS
1.0000 | ORAL_TABLET | ORAL | Status: DC | PRN
  Administered 2015-07-24: 1 via ORAL
  Filled 2015-07-23 (×2): qty 1

## 2015-07-23 MED ORDER — METHYLERGONOVINE MALEATE 0.2 MG/ML IJ SOLN: 0.2000 mg | INTRAMUSCULAR | Status: DC | PRN

## 2015-07-23 MED ORDER — BENZOCAINE-MENTHOL 20-0.5 % EX AERO
1.0000 | INHALATION_SPRAY | CUTANEOUS | Status: DC | PRN
Start: 2015-07-23 — End: 2015-07-25
  Administered 2015-07-24: 1 via TOPICAL
  Filled 2015-07-23: qty 56

## 2015-07-23 MED ORDER — DIPHENHYDRAMINE HCL 25 MG PO CAPS: 25.0000 mg | ORAL_CAPSULE | Freq: Four times a day (QID) | ORAL | Status: DC | PRN

## 2015-07-23 MED ORDER — LIDOCAINE HCL (PF) 1 % IJ SOLN
30.0000 mL | INTRAMUSCULAR | Status: DC | PRN
  Filled 2015-07-23: qty 30

## 2015-07-23 MED ORDER — DIPHENHYDRAMINE HCL 50 MG/ML IJ SOLN: 12.5000 mg | INTRAMUSCULAR | Status: DC | PRN

## 2015-07-23 MED ORDER — LACTATED RINGERS IV SOLN
INTRAVENOUS | Status: DC
  Administered 2015-07-23 (×2): via INTRAVENOUS

## 2015-07-23 MED ORDER — ACETAMINOPHEN 325 MG PO TABS
650.0000 mg | ORAL_TABLET | ORAL | Status: DC | PRN
Start: 2015-07-23 — End: 2015-07-25
  Administered 2015-07-24 (×3): 650 mg via ORAL
  Filled 2015-07-23 (×3): qty 2

## 2015-07-23 MED ORDER — OXYTOCIN 40 UNITS IN LACTATED RINGERS INFUSION - SIMPLE MED
62.5000 mL/h | INTRAVENOUS | Status: DC
  Administered 2015-07-23: 62.5 mL/h via INTRAVENOUS

## 2015-07-23 MED ORDER — ACETAMINOPHEN 325 MG PO TABS: 650.0000 mg | ORAL_TABLET | ORAL | Status: DC | PRN

## 2015-07-23 MED ORDER — TETANUS-DIPHTH-ACELL PERTUSSIS 5-2.5-18.5 LF-MCG/0.5 IM SUSP: 0.5000 mL | Freq: Once | INTRAMUSCULAR | Status: DC

## 2015-07-23 MED ORDER — OXYCODONE-ACETAMINOPHEN 5-325 MG PO TABS: 1.0000 | ORAL_TABLET | ORAL | Status: DC | PRN

## 2015-07-23 MED ORDER — DIBUCAINE 1 % RE OINT
1.0000 | TOPICAL_OINTMENT | RECTAL | Status: DC | PRN
Start: 2015-07-23 — End: 2015-07-25

## 2015-07-23 MED ORDER — METHYLERGONOVINE MALEATE 0.2 MG PO TABS: 0.2000 mg | ORAL_TABLET | ORAL | Status: DC | PRN

## 2015-07-23 MED ORDER — OXYCODONE-ACETAMINOPHEN 5-325 MG PO TABS: 2.0000 | ORAL_TABLET | ORAL | Status: DC | PRN

## 2015-07-23 MED ORDER — WITCH HAZEL-GLYCERIN EX PADS: 1.0000 "application " | MEDICATED_PAD | CUTANEOUS | Status: DC | PRN

## 2015-07-23 MED ORDER — TERBUTALINE SULFATE 1 MG/ML IJ SOLN
0.2500 mg | Freq: Once | INTRAMUSCULAR | Status: DC | PRN
  Filled 2015-07-23: qty 1

## 2015-07-23 MED ORDER — EPHEDRINE 5 MG/ML INJ
10.0000 mg | INTRAVENOUS | Status: DC | PRN
  Filled 2015-07-23: qty 2

## 2015-07-23 MED ORDER — CITRIC ACID-SODIUM CITRATE 334-500 MG/5ML PO SOLN
30.0000 mL | ORAL | Status: DC | PRN
Start: 2015-07-23 — End: 2015-07-23

## 2015-07-23 MED ORDER — PHENYLEPHRINE 40 MCG/ML (10ML) SYRINGE FOR IV PUSH (FOR BLOOD PRESSURE SUPPORT)
80.0000 ug | PREFILLED_SYRINGE | INTRAVENOUS | Status: DC | PRN
  Filled 2015-07-23: qty 2
  Filled 2015-07-23: qty 20

## 2015-07-23 MED ORDER — ZOLPIDEM TARTRATE 5 MG PO TABS: 5.0000 mg | ORAL_TABLET | Freq: Every evening | ORAL | Status: DC | PRN

## 2015-07-23 MED ORDER — PRENATAL MULTIVITAMIN CH
1.0000 | ORAL_TABLET | Freq: Every day | ORAL | Status: DC
  Administered 2015-07-24: 1 via ORAL
  Filled 2015-07-23: qty 1

## 2015-07-23 MED ORDER — LACTATED RINGERS IV SOLN: 500.0000 mL | INTRAVENOUS | Status: DC | PRN

## 2015-07-23 MED ORDER — LANOLIN HYDROUS EX OINT: TOPICAL_OINTMENT | CUTANEOUS | Status: DC | PRN

## 2015-07-23 MED ORDER — SENNOSIDES-DOCUSATE SODIUM 8.6-50 MG PO TABS
2.0000 | ORAL_TABLET | ORAL | Status: DC
  Administered 2015-07-23 – 2015-07-25 (×2): 2 via ORAL
  Filled 2015-07-23 (×2): qty 2

## 2015-07-23 MED ORDER — ONDANSETRON HCL 4 MG PO TABS: 4.0000 mg | ORAL_TABLET | ORAL | Status: DC | PRN

## 2015-07-23 MED ORDER — ONDANSETRON HCL 4 MG/2ML IJ SOLN: 4.0000 mg | INTRAMUSCULAR | Status: DC | PRN

## 2015-07-23 MED ORDER — OXYTOCIN BOLUS FROM INFUSION: 500.0000 mL | INTRAVENOUS | Status: DC

## 2015-07-23 MED ORDER — ONDANSETRON HCL 4 MG/2ML IJ SOLN: 4.0000 mg | Freq: Four times a day (QID) | INTRAMUSCULAR | Status: DC | PRN

## 2015-07-23 MED ORDER — SIMETHICONE 80 MG PO CHEW: 80.0000 mg | CHEWABLE_TABLET | ORAL | Status: DC | PRN

## 2015-07-23 MED ORDER — FENTANYL 2.5 MCG/ML BUPIVACAINE 1/10 % EPIDURAL INFUSION (WH - ANES)
14.0000 mL/h | INTRAMUSCULAR | Status: DC | PRN
  Administered 2015-07-23 (×2): 14 mL/h via EPIDURAL
  Filled 2015-07-23 (×2): qty 125

## 2015-07-23 MED ORDER — BUTORPHANOL TARTRATE 1 MG/ML IJ SOLN: 1.0000 mg | INTRAMUSCULAR | Status: DC | PRN

## 2015-07-23 NOTE — Anesthesia Procedure Notes (Signed)
Epidural Patient location during procedure: OB  Staffing Anesthesiologist: Carlyle Mcelrath Performed by: anesthesiologist   Preanesthetic Checklist Completed: patient identified, site marked, surgical consent, pre-op evaluation, timeout performed, IV checked, risks and benefits discussed and monitors and equipment checked  Epidural Patient position: sitting Prep: site prepped and draped and DuraPrep Patient monitoring: continuous pulse ox and blood pressure Approach: midline Location: L3-L4 Injection technique: LOR saline  Needle:  Needle type: Tuohy  Needle gauge: 17 G Needle length: 9 cm and 9 Needle insertion depth: 6 cm Catheter type: closed end flexible Catheter size: 19 Gauge Catheter at skin depth: 10 cm Test dose: negative  Assessment Events: blood not aspirated, injection not painful, no injection resistance, negative IV test and no paresthesia  Additional Notes Patient identified. Risks/Benefits/Options discussed with patient including but not limited to bleeding, infection, nerve damage, paralysis, failed block, incomplete pain control, headache, blood pressure changes, nausea, vomiting, reactions to medication both or allergic, itching and postpartum back pain. Confirmed with bedside nurse the patient's most recent platelet count. Confirmed with patient that they are not currently taking any anticoagulation, have any bleeding history or any family history of bleeding disorders. Patient expressed understanding and wished to proceed. All questions were answered. Sterile technique was used throughout the entire procedure. Please see nursing notes for vital signs. Test dose was given through epidural catheter and negative prior to continuing to dose epidural or start infusion. Warning signs of high block given to the patient including shortness of breath, tingling/numbness in hands, complete motor block, or any concerning symptoms with instructions to call for help. Patient was  given instructions on fall risk and not to get out of bed. All questions and concerns addressed with instructions to call with any issues or inadequate analgesia.    

## 2015-07-23 NOTE — Anesthesia Preprocedure Evaluation (Signed)
Anesthesia Evaluation  Patient identified by MRN, date of birth, ID band Patient awake    Reviewed: Allergy & Precautions, NPO status , Patient's Chart, lab work & pertinent test results  History of Anesthesia Complications Negative for: history of anesthetic complications  Airway Mallampati: II  TM Distance: >3 FB Neck ROM: Full    Dental no notable dental hx. (+) Dental Advisory Given   Pulmonary neg pulmonary ROS,    Pulmonary exam normal breath sounds clear to auscultation       Cardiovascular negative cardio ROS Normal cardiovascular exam Rhythm:Regular Rate:Normal     Neuro/Psych negative neurological ROS  negative psych ROS   GI/Hepatic negative GI ROS, Neg liver ROS,   Endo/Other  diabetes, Gestationalobesity  Renal/GU negative Renal ROS  negative genitourinary   Musculoskeletal negative musculoskeletal ROS (+)   Abdominal   Peds negative pediatric ROS (+)  Hematology negative hematology ROS (+)   Anesthesia Other Findings   Reproductive/Obstetrics (+) Pregnancy                             Anesthesia Physical Anesthesia Plan  ASA: II  Anesthesia Plan: Epidural   Post-op Pain Management:    Induction:   Airway Management Planned:   Additional Equipment:   Intra-op Plan:   Post-operative Plan:   Informed Consent: I have reviewed the patients History and Physical, chart, labs and discussed the procedure including the risks, benefits and alternatives for the proposed anesthesia with the patient or authorized representative who has indicated his/her understanding and acceptance.   Dental advisory given  Plan Discussed with: CRNA  Anesthesia Plan Comments:         Anesthesia Quick Evaluation

## 2015-07-23 NOTE — H&P (Signed)
Misty Brown is a 34 y.o. female presenting for Induction of labor  34 yo G2P1001 @ 39+4 presents for IOL for gestational diabetes. Her pregnancy has been complicated by echogenic bowel and a thickened nuchal fold on US at 18 weeks. She was seen by MFM, NIPT low risk female. Serologies for TORCH were negative. She has diet controlled gestational diabetes  History OB History    Gravida Para Term Preterm AB TAB SAB Ectopic Multiple Living   2 1 1       1      Past Medical History  Diagnosis Date  . Medical history non-contributory   . Gestational diabetes    Past Surgical History  Procedure Laterality Date  . Wisdom tooth extraction    . No past surgeries     Family History: family history includes Heart disease in her maternal grandfather and maternal grandmother; Hypertension in her mother; Stroke in her paternal grandmother. Social History:  reports that she has never smoked. She has never used smokeless tobacco. She reports that she does not drink alcohol or use illicit drugs.   Prenatal Transfer Tool  Maternal Diabetes: Yes:  Diabetes Type:  Diet controlled Genetic Screening: Abnormal:  Results: Elevated risk of Trisomy 21, NIPT low risk female Maternal Ultrasounds/Referrals: Abnormal:  Findings:   Echogenic bowel, Other:Thickened nuchal fold Fetal Ultrasounds or other Referrals:  Referred to Materal Fetal Medicine  Maternal Substance Abuse:  No Significant Maternal Medications:  None Significant Maternal Lab Results:  None Other Comments:  None  ROS  Dilation: 3 Effacement (%): 60 Station: -3 Exam by:: dr Tenny Crawross Blood pressure 127/70, pulse 83, temperature 99 F (37.2 C), temperature source Oral, resp. rate 20, height 5\' 9"  (1.753 m), weight 235 lb (106.595 kg). Exam Physical Exam  Prenatal labs: ABO, Rh:  O neg Antibody:   Neg Rubella: Immune (03/31 0000) RPR: Nonreactive (03/31 0000)  HBsAg: Negative (03/31 0000)  HIV: Non-reactive (03/31 0000)  GBS:    Negative  Assessment/Plan: 1) Admit 2) Epidural  3) Pitocin 4) AROM when able   Misty Brown H. 07/23/2015, 9:56 AM

## 2015-07-24 LAB — CBC
HCT: 35.5 % — ABNORMAL LOW (ref 36.0–46.0)
HEMOGLOBIN: 11.3 g/dL — AB (ref 12.0–15.0)
MCH: 23.9 pg — ABNORMAL LOW (ref 26.0–34.0)
MCHC: 31.8 g/dL (ref 30.0–36.0)
MCV: 75.2 fL — ABNORMAL LOW (ref 78.0–100.0)
PLATELETS: 202 10*3/uL (ref 150–400)
RBC: 4.72 MIL/uL (ref 3.87–5.11)
RDW: 15 % (ref 11.5–15.5)
WBC: 9.2 10*3/uL (ref 4.0–10.5)

## 2015-07-24 LAB — RPR: RPR: NONREACTIVE

## 2015-07-24 NOTE — Progress Notes (Signed)
Patient is eating, ambulating, voiding.  Pain control is good.  Filed Vitals:   07/23/15 1801 07/23/15 1830 07/23/15 1933 07/23/15 2255  BP: 130/66 125/79 142/79 122/70  Pulse: 75 68 82 71  Temp:  98.2 F (36.8 C) 98.8 F (37.1 C) 98.3 F (36.8 C)  TempSrc:  Oral Oral Oral  Resp: 18 18 18 18   Height:      Weight:      SpO2:   100% 97%    Fundus firm Perineum without swelling.  Lab Results  Component Value Date   WBC 9.2 07/24/2015   HGB 11.3* 07/24/2015   HCT 35.5* 07/24/2015   MCV 75.2* 07/24/2015   PLT 202 07/24/2015    --/--/O NEG (10/24 0900)/RI  A/P Post partum day 1.  Routine care.  Expect d/c routine.  Baby is O neg- no Rhogam.  Brynnan Rodenbaugh A

## 2015-07-24 NOTE — Anesthesia Postprocedure Evaluation (Signed)
  Anesthesia Post-op Note  Patient: Misty Brown  Procedure(s) Performed: * No procedures listed *  Patient Location: PACU and Mother/Baby  Anesthesia Type:Epidural  Level of Consciousness: awake, alert  and oriented  Airway and Oxygen Therapy: Patient Spontanous Breathing  Post-op Pain: none  Post-op Assessment: Post-op Vital signs reviewed, Patient's Cardiovascular Status Stable, No headache, No backache, No residual numbness and No residual motor weakness  Post-op Vital Signs: Reviewed and stable  Complications: No apparent anesthesia complications

## 2015-07-25 NOTE — Discharge Summary (Signed)
Obstetric Discharge Summary Reason for Admission: induction of labor and GDM Prenatal Procedures: ultrasound Intrapartum Procedures: spontaneous vaginal delivery Postpartum Procedures: none Complications-Operative and Postpartum: 1st degree perineal laceration HEMOGLOBIN  Date Value Ref Range Status  07/24/2015 11.3* 12.0 - 15.0 g/dL Final   HCT  Date Value Ref Range Status  07/24/2015 35.5* 36.0 - 46.0 % Final    Physical Exam:  General: alert and cooperative Lochia: appropriate Uterine Fundus: firm DVT Evaluation: No evidence of DVT seen on physical exam.  Discharge Diagnoses: Term Pregnancy-delivered  Discharge Information: Date: 07/25/2015 Activity: pelvic rest Diet: routine Medications: PNV and Ibuprofen Condition: stable Instructions: refer to practice specific booklet Discharge to: home Follow-up Information    Follow up with Almon HerculesOSS,KENDRA H., MD In 4 weeks.   Specialty:  Obstetrics and Gynecology   Contact information:   [address] [place] [address] [phone]       Newborn Data: Live born female  Birth Weight: 7 lb 1.8 oz (3225 g) APGAR: 9, 9  Home with mother.  Philip AspenCALLAHAN, Kieryn Burtis 07/25/2015, 9:50 AM

## 2016-01-10 ENCOUNTER — Ambulatory Visit (INDEPENDENT_AMBULATORY_CARE_PROVIDER_SITE_OTHER): Payer: BLUE CROSS/BLUE SHIELD | Admitting: Family Medicine

## 2016-01-10 VITALS — BP 132/80 | HR 120 | Temp 100.9°F | Wt 227.0 lb

## 2016-01-10 DIAGNOSIS — J020 Streptococcal pharyngitis: Secondary | ICD-10-CM | POA: Diagnosis not present

## 2016-01-10 LAB — POCT RAPID STREP A (OFFICE): RAPID STREP A SCREEN: POSITIVE — AB

## 2016-01-10 MED ORDER — PENICILLIN V POTASSIUM 500 MG PO TABS: 500.0000 mg | ORAL_TABLET | Freq: Three times a day (TID) | ORAL | Status: DC

## 2016-01-10 NOTE — Assessment & Plan Note (Signed)
Rapid strep pos PCN 500 mg three times daily x 10 days. Continue Ibuprofen as needed- up to 800 mg three times daily with food.

## 2016-01-10 NOTE — Patient Instructions (Signed)

## 2016-01-10 NOTE — Progress Notes (Signed)
SUBJECTIVE: 35 y.o. female with sore throat, myalgias, swollen glands, headache and fever for 1 day. No history of rheumatic fever. Other symptoms: coryza, congestion, sore throat and swollen glands.  Daughter diagnosed with strep last week.  Current Outpatient Prescriptions on File Prior to Visit  Medication Sig Dispense Refill  . Prenatal Vit-Fe Fumarate-FA (PRENATAL VITAMIN PO) Take by mouth.     No current facility-administered medications on file prior to visit.    Allergies  Allergen Reactions  . Dicyclomine     Abdominal pain     Past Medical History  Diagnosis Date  . Medical history non-contributory   . Gestational diabetes     Past Surgical History  Procedure Laterality Date  . Wisdom tooth extraction    . No past surgeries      Family History  Problem Relation Age of Onset  . Hypertension Mother   . Heart disease Maternal Grandmother   . Heart disease Maternal Grandfather   . Stroke Paternal Grandmother     Social History   Social History  . Marital Status: Married    Spouse Name: N/A  . Number of Children: N/A  . Years of Education: N/A   Occupational History  . Not on file.   Social History Main Topics  . Smoking status: Never Smoker   . Smokeless tobacco: Never Used  . Alcohol Use: No  . Drug Use: No  . Sexual Activity: Yes   Other Topics Concern  . Not on file   Social History Narrative   The PMH, PSH, Social History, Family History, Medications, and allergies [facility], and have been updated if relevant.  OBJECTIVE:  BP 132/80 mmHg  Pulse 120  Temp(Src) 100.9 F (38.3 C) (Oral)  Wt 227 lb (102.967 kg)  SpO2 98%  Vitals as noted above. Appears alert, well appearing, and in no distress. Ears: bilateral TM's and external ear canals normal Oropharynx: tonsils hypertrophied with exudate Neck: supple, no significant adenopathy Lungs: clear to auscultation, no wheezes, rales or rhonchi, symmetric air entry Rapid  Strep test is positive  ASSESSMENT: Streptococcal pharyngitis  PLAN: Per orders. PCN 500 mg three times daily x 10 days, Gargle, use acetaminophen or other OTC analgesic, and take Rx fully as prescribed. Call if other family members develop similar symptoms. See prn.

## 2016-01-10 NOTE — Progress Notes (Signed)
Pre visit review using our clinic review tool, if applicable. No additional management support is needed unless otherwise documented below in the visit note. 

## 2016-08-04 DIAGNOSIS — Z3201 Encounter for pregnancy test, result positive: Secondary | ICD-10-CM | POA: Diagnosis not present

## 2016-08-04 DIAGNOSIS — Z124 Encounter for screening for malignant neoplasm of cervix: Secondary | ICD-10-CM | POA: Diagnosis not present

## 2016-08-04 DIAGNOSIS — N925 Other specified irregular menstruation: Secondary | ICD-10-CM | POA: Diagnosis not present

## 2016-08-04 DIAGNOSIS — Z01419 Encounter for gynecological examination (general) (routine) without abnormal findings: Secondary | ICD-10-CM | POA: Diagnosis not present

## 2016-08-04 DIAGNOSIS — Z6836 Body mass index (BMI) 36.0-36.9, adult: Secondary | ICD-10-CM | POA: Diagnosis not present

## 2016-08-26 DIAGNOSIS — O3680X0 Pregnancy with inconclusive fetal viability, not applicable or unspecified: Secondary | ICD-10-CM | POA: Diagnosis not present

## 2016-08-26 DIAGNOSIS — Z3490 Encounter for supervision of normal pregnancy, unspecified, unspecified trimester: Secondary | ICD-10-CM | POA: Insufficient documentation

## 2016-08-26 DIAGNOSIS — Z3480 Encounter for supervision of other normal pregnancy, unspecified trimester: Secondary | ICD-10-CM | POA: Diagnosis not present

## 2016-08-26 DIAGNOSIS — Z6835 Body mass index (BMI) 35.0-35.9, adult: Secondary | ICD-10-CM | POA: Diagnosis not present

## 2016-08-26 LAB — OB RESULTS CONSOLE ANTIBODY SCREEN: Antibody Screen: NEGATIVE

## 2016-08-26 LAB — OB RESULTS CONSOLE ABO/RH: RH Type: NEGATIVE

## 2016-08-26 LAB — OB RESULTS CONSOLE RUBELLA ANTIBODY, IGM: Rubella: IMMUNE

## 2016-08-26 LAB — OB RESULTS CONSOLE RPR: RPR: NONREACTIVE

## 2016-08-26 LAB — OB RESULTS CONSOLE HIV ANTIBODY (ROUTINE TESTING): HIV: NONREACTIVE

## 2016-08-26 LAB — OB RESULTS CONSOLE HEPATITIS B SURFACE ANTIGEN: Hepatitis B Surface Ag: NEGATIVE

## 2016-09-17 DIAGNOSIS — O09521 Supervision of elderly multigravida, first trimester: Secondary | ICD-10-CM | POA: Diagnosis not present

## 2016-09-17 DIAGNOSIS — Z3682 Encounter for antenatal screening for nuchal translucency: Secondary | ICD-10-CM | POA: Diagnosis not present

## 2016-09-17 DIAGNOSIS — Z6836 Body mass index (BMI) 36.0-36.9, adult: Secondary | ICD-10-CM | POA: Diagnosis not present

## 2016-09-17 DIAGNOSIS — O09511 Supervision of elderly primigravida, first trimester: Secondary | ICD-10-CM | POA: Diagnosis not present

## 2016-09-29 NOTE — L&D Delivery Note (Signed)
Delivery Note Patient pushed for approximately an hour after she was noted to be C/C/+1 At 7:07 PM a viable and healthy female was delivered via Vaginal, Spontaneous Delivery (Presentation LOA) over intact perineum. Baby laid on maternal abdomen. Delayed cord clamping done and cut by father.  APGAR: 9, 9; weight pending .   Placenta delivered spontaneously intact with 3 vessels.  Complications: none.  Uterine atony alleviated by IV pitocin and  Massage. Small vaginal abrasion repaired with 3-0 chromic.  Patient tolerated delivery well.   Will make her NPO as she desires a post partum tubal.   Anesthesia:   Episiotomy: None Lacerations: 1st degree;Vaginal Suture Repair: 3.0 chromic SH Est. Blood Loss (mL): 300  Mom to postpartum.  Baby to Couplet care / Skin to Skin.  Essie HartINN, Daisha Filosa STACIA 03/23/2017, 7:42 PM

## 2016-10-22 DIAGNOSIS — Z3480 Encounter for supervision of other normal pregnancy, unspecified trimester: Secondary | ICD-10-CM | POA: Diagnosis not present

## 2016-11-11 DIAGNOSIS — Z363 Encounter for antenatal screening for malformations: Secondary | ICD-10-CM | POA: Diagnosis not present

## 2016-11-11 DIAGNOSIS — Z6836 Body mass index (BMI) 36.0-36.9, adult: Secondary | ICD-10-CM | POA: Diagnosis not present

## 2016-12-24 DIAGNOSIS — Z3480 Encounter for supervision of other normal pregnancy, unspecified trimester: Secondary | ICD-10-CM | POA: Diagnosis not present

## 2017-01-08 DIAGNOSIS — O09521 Supervision of elderly multigravida, first trimester: Secondary | ICD-10-CM | POA: Diagnosis not present

## 2017-01-08 DIAGNOSIS — O10019 Pre-existing essential hypertension complicating pregnancy, unspecified trimester: Secondary | ICD-10-CM | POA: Diagnosis not present

## 2017-01-22 DIAGNOSIS — O360990 Maternal care for other rhesus isoimmunization, unspecified trimester, not applicable or unspecified: Secondary | ICD-10-CM | POA: Diagnosis not present

## 2017-01-22 DIAGNOSIS — Z6836 Body mass index (BMI) 36.0-36.9, adult: Secondary | ICD-10-CM | POA: Diagnosis not present

## 2017-02-06 DIAGNOSIS — O10019 Pre-existing essential hypertension complicating pregnancy, unspecified trimester: Secondary | ICD-10-CM | POA: Diagnosis not present

## 2017-03-04 DIAGNOSIS — O09523 Supervision of elderly multigravida, third trimester: Secondary | ICD-10-CM | POA: Diagnosis not present

## 2017-03-04 DIAGNOSIS — Z3480 Encounter for supervision of other normal pregnancy, unspecified trimester: Secondary | ICD-10-CM | POA: Diagnosis not present

## 2017-03-04 DIAGNOSIS — Z369 Encounter for antenatal screening, unspecified: Secondary | ICD-10-CM | POA: Diagnosis not present

## 2017-03-04 LAB — OB RESULTS CONSOLE GBS: GBS: NEGATIVE

## 2017-03-09 ENCOUNTER — Other Ambulatory Visit: Payer: Self-pay | Admitting: Obstetrics & Gynecology

## 2017-03-11 ENCOUNTER — Telehealth (HOSPITAL_COMMUNITY): Payer: Self-pay | Admitting: *Deleted

## 2017-03-11 ENCOUNTER — Encounter (HOSPITAL_COMMUNITY): Payer: Self-pay | Admitting: *Deleted

## 2017-03-11 DIAGNOSIS — O09523 Supervision of elderly multigravida, third trimester: Secondary | ICD-10-CM | POA: Diagnosis not present

## 2017-03-11 NOTE — Telephone Encounter (Signed)
Preadmission screen  

## 2017-03-17 DIAGNOSIS — O26849 Uterine size-date discrepancy, unspecified trimester: Secondary | ICD-10-CM | POA: Diagnosis not present

## 2017-03-17 DIAGNOSIS — O09523 Supervision of elderly multigravida, third trimester: Secondary | ICD-10-CM | POA: Diagnosis not present

## 2017-03-18 ENCOUNTER — Telehealth (HOSPITAL_COMMUNITY): Payer: Self-pay | Admitting: *Deleted

## 2017-03-18 ENCOUNTER — Encounter (HOSPITAL_COMMUNITY): Payer: Self-pay | Admitting: *Deleted

## 2017-03-18 NOTE — Telephone Encounter (Signed)
Preadmission screen  

## 2017-03-20 ENCOUNTER — Encounter (HOSPITAL_COMMUNITY): Payer: Self-pay | Admitting: *Deleted

## 2017-03-20 ENCOUNTER — Inpatient Hospital Stay (HOSPITAL_COMMUNITY)
Admission: RE | Admit: 2017-03-20 | Discharge: 2017-03-20 | Disposition: A | Discharge status: Home Health | Payer: BLUE CROSS/BLUE SHIELD | Source: Ambulatory Visit | Attending: Obstetrics & Gynecology | Admitting: Obstetrics & Gynecology

## 2017-03-20 DIAGNOSIS — O368130 Decreased fetal movements, third trimester, not applicable or unspecified: Secondary | ICD-10-CM | POA: Diagnosis not present

## 2017-03-20 DIAGNOSIS — Z3A38 38 weeks gestation of pregnancy: Secondary | ICD-10-CM | POA: Diagnosis not present

## 2017-03-20 DIAGNOSIS — Z3689 Encounter for other specified antenatal screening: Secondary | ICD-10-CM

## 2017-03-20 NOTE — MAU Note (Signed)
Pt reports decreased FM since yesterday, states she had to run through her yard @ that time.  Has been followed in office for elevated BP.  Denies uc's, bleeding, LOF.  Denies HA or visual changes.

## 2017-03-20 NOTE — MAU Note (Signed)
Urine sent to lab 

## 2017-03-23 ENCOUNTER — Inpatient Hospital Stay (HOSPITAL_COMMUNITY): Payer: BLUE CROSS/BLUE SHIELD | Admitting: Anesthesiology

## 2017-03-23 ENCOUNTER — Encounter (HOSPITAL_COMMUNITY): Payer: Self-pay

## 2017-03-23 ENCOUNTER — Ambulatory Visit (HOSPITAL_COMMUNITY): Admission: RE | Admit: 2017-03-23 | Payer: BLUE CROSS/BLUE SHIELD | Source: Ambulatory Visit

## 2017-03-23 ENCOUNTER — Encounter (HOSPITAL_COMMUNITY)
Admission: RE | Disposition: A | Discharge status: Home / Self Care | Payer: Self-pay | Source: Ambulatory Visit | Attending: Obstetrics & Gynecology

## 2017-03-23 ENCOUNTER — Inpatient Hospital Stay (HOSPITAL_COMMUNITY)
Admission: RE | Admit: 2017-03-23 | Discharge: 2017-03-24 | DRG: 767 | Disposition: A | Discharge status: Home / Self Care | Payer: BLUE CROSS/BLUE SHIELD | Source: Ambulatory Visit | Attending: Obstetrics & Gynecology | Admitting: Obstetrics & Gynecology

## 2017-03-23 DIAGNOSIS — O1002 Pre-existing essential hypertension complicating childbirth: Secondary | ICD-10-CM | POA: Diagnosis not present

## 2017-03-23 DIAGNOSIS — Z6791 Unspecified blood type, Rh negative: Secondary | ICD-10-CM

## 2017-03-23 DIAGNOSIS — Z3A38 38 weeks gestation of pregnancy: Secondary | ICD-10-CM | POA: Diagnosis not present

## 2017-03-23 DIAGNOSIS — I10 Essential (primary) hypertension: Secondary | ICD-10-CM | POA: Diagnosis present

## 2017-03-23 DIAGNOSIS — Z302 Encounter for sterilization: Secondary | ICD-10-CM

## 2017-03-23 DIAGNOSIS — O26893 Other specified pregnancy related conditions, third trimester: Secondary | ICD-10-CM | POA: Diagnosis present

## 2017-03-23 DIAGNOSIS — Z23 Encounter for immunization: Secondary | ICD-10-CM | POA: Diagnosis not present

## 2017-03-23 DIAGNOSIS — O24429 Gestational diabetes mellitus in childbirth, unspecified control: Secondary | ICD-10-CM | POA: Diagnosis not present

## 2017-03-23 HISTORY — PX: TUBAL LIGATION: SHX77

## 2017-03-23 LAB — CBC
HCT: 35.2 % — ABNORMAL LOW (ref 36.0–46.0)
Hemoglobin: 11.5 g/dL — ABNORMAL LOW (ref 12.0–15.0)
MCH: 24.5 pg — ABNORMAL LOW (ref 26.0–34.0)
MCHC: 32.7 g/dL (ref 30.0–36.0)
MCV: 74.9 fL — ABNORMAL LOW (ref 78.0–100.0)
Platelets: 215 10*3/uL (ref 150–400)
RBC: 4.7 MIL/uL (ref 3.87–5.11)
RDW: 15.1 % (ref 11.5–15.5)
WBC: 8.4 10*3/uL (ref 4.0–10.5)

## 2017-03-23 LAB — TYPE AND SCREEN
ABO/RH(D): O NEG
Antibody Screen: NEGATIVE

## 2017-03-23 LAB — RPR: RPR: NONREACTIVE

## 2017-03-23 SURGERY — LIGATION, FALLOPIAN TUBE, POSTPARTUM
Anesthesia: Choice

## 2017-03-23 SURGERY — LIGATION, FALLOPIAN TUBE, POSTPARTUM
Anesthesia: Epidural | Site: Abdomen

## 2017-03-23 MED ORDER — FENTANYL CITRATE (PF) 100 MCG/2ML IJ SOLN
INTRAMUSCULAR | Status: AC
  Filled 2017-03-23: qty 2

## 2017-03-23 MED ORDER — OXYTOCIN BOLUS FROM INFUSION
500.0000 mL | Freq: Once | INTRAVENOUS | Status: AC
  Administered 2017-03-23: 500 mL via INTRAVENOUS

## 2017-03-23 MED ORDER — MIDAZOLAM HCL 2 MG/2ML IJ SOLN
INTRAMUSCULAR | Status: DC | PRN
  Administered 2017-03-23: 2 mg via INTRAVENOUS

## 2017-03-23 MED ORDER — ACETAMINOPHEN 325 MG PO TABS: 650.0000 mg | ORAL_TABLET | ORAL | Status: DC | PRN

## 2017-03-23 MED ORDER — EPHEDRINE 5 MG/ML INJ: 10.0000 mg | INTRAVENOUS | Status: DC | PRN

## 2017-03-23 MED ORDER — FENTANYL 2.5 MCG/ML BUPIVACAINE 1/10 % EPIDURAL INFUSION (WH - ANES)
14.0000 mL/h | INTRAMUSCULAR | Status: DC | PRN
  Administered 2017-03-23: 14 mL/h via EPIDURAL
  Filled 2017-03-23: qty 100

## 2017-03-23 MED ORDER — SIMETHICONE 80 MG PO CHEW: 80.0000 mg | CHEWABLE_TABLET | ORAL | Status: DC | PRN

## 2017-03-23 MED ORDER — PRENATAL MULTIVITAMIN CH
1.0000 | ORAL_TABLET | Freq: Every day | ORAL | Status: DC
  Administered 2017-03-24: 1 via ORAL
  Filled 2017-03-23: qty 1

## 2017-03-23 MED ORDER — SODIUM BICARBONATE 8.4 % IV SOLN
INTRAVENOUS | Status: DC | PRN
  Administered 2017-03-23 (×3): 5 mL via EPIDURAL

## 2017-03-23 MED ORDER — BUPIVACAINE HCL (PF) 0.25 % IJ SOLN
INTRAMUSCULAR | Status: DC | PRN
  Administered 2017-03-23: 23 mL

## 2017-03-23 MED ORDER — SODIUM BICARBONATE 8.4 % IV SOLN
INTRAVENOUS | Status: AC
  Filled 2017-03-23: qty 50

## 2017-03-23 MED ORDER — HYDROMORPHONE HCL 1 MG/ML IJ SOLN
0.2500 mg | INTRAMUSCULAR | Status: DC | PRN
  Administered 2017-03-23 (×2): 0.25 mg via INTRAVENOUS

## 2017-03-23 MED ORDER — WITCH HAZEL-GLYCERIN EX PADS: 1.0000 "application " | MEDICATED_PAD | CUTANEOUS | Status: DC | PRN

## 2017-03-23 MED ORDER — TERBUTALINE SULFATE 1 MG/ML IJ SOLN: 0.2500 mg | Freq: Once | INTRAMUSCULAR | Status: DC | PRN

## 2017-03-23 MED ORDER — ONDANSETRON HCL 4 MG/2ML IJ SOLN: 4.0000 mg | Freq: Four times a day (QID) | INTRAMUSCULAR | Status: DC | PRN

## 2017-03-23 MED ORDER — BUPIVACAINE-EPINEPHRINE (PF) 0.25% -1:200000 IJ SOLN
INTRAMUSCULAR | Status: AC
  Filled 2017-03-23: qty 30

## 2017-03-23 MED ORDER — LACTATED RINGERS IV SOLN
INTRAVENOUS | Status: DC
  Administered 2017-03-23 (×2): via INTRAVENOUS

## 2017-03-23 MED ORDER — OXYCODONE-ACETAMINOPHEN 5-325 MG PO TABS
1.0000 | ORAL_TABLET | ORAL | Status: DC | PRN
  Administered 2017-03-24 (×4): 1 via ORAL
  Filled 2017-03-23 (×4): qty 1

## 2017-03-23 MED ORDER — PHENYLEPHRINE 40 MCG/ML (10ML) SYRINGE FOR IV PUSH (FOR BLOOD PRESSURE SUPPORT): 80.0000 ug | PREFILLED_SYRINGE | INTRAVENOUS | Status: DC | PRN

## 2017-03-23 MED ORDER — OXYTOCIN 40 UNITS IN LACTATED RINGERS INFUSION - SIMPLE MED: 2.5000 [IU]/h | INTRAVENOUS | Status: DC | PRN

## 2017-03-23 MED ORDER — DIBUCAINE 1 % RE OINT: 1.0000 "application " | TOPICAL_OINTMENT | RECTAL | Status: DC | PRN

## 2017-03-23 MED ORDER — KETOROLAC TROMETHAMINE 30 MG/ML IJ SOLN
INTRAMUSCULAR | Status: AC
  Administered 2017-03-23: 30 mg via INTRAVENOUS
  Filled 2017-03-23: qty 1

## 2017-03-23 MED ORDER — ONDANSETRON HCL 4 MG PO TABS: 4.0000 mg | ORAL_TABLET | ORAL | Status: DC | PRN

## 2017-03-23 MED ORDER — LACTATED RINGERS IV SOLN
INTRAVENOUS | Status: DC | PRN
  Administered 2017-03-23: 21:00:00 via INTRAVENOUS

## 2017-03-23 MED ORDER — LACTATED RINGERS IV SOLN
500.0000 mL | Freq: Once | INTRAVENOUS | Status: AC
  Administered 2017-03-23: 500 mL via INTRAVENOUS

## 2017-03-23 MED ORDER — DIPHENHYDRAMINE HCL 50 MG/ML IJ SOLN: 12.5000 mg | INTRAMUSCULAR | Status: DC | PRN

## 2017-03-23 MED ORDER — PROMETHAZINE HCL 25 MG/ML IJ SOLN: 6.2500 mg | INTRAMUSCULAR | Status: DC | PRN

## 2017-03-23 MED ORDER — ERYTHROMYCIN 5 MG/GM OP OINT
TOPICAL_OINTMENT | OPHTHALMIC | Status: AC
  Filled 2017-03-23: qty 1

## 2017-03-23 MED ORDER — SENNOSIDES-DOCUSATE SODIUM 8.6-50 MG PO TABS
2.0000 | ORAL_TABLET | ORAL | Status: DC
  Administered 2017-03-24: 2 via ORAL
  Filled 2017-03-23: qty 2

## 2017-03-23 MED ORDER — DIPHENHYDRAMINE HCL 25 MG PO CAPS: 25.0000 mg | ORAL_CAPSULE | Freq: Four times a day (QID) | ORAL | Status: DC | PRN

## 2017-03-23 MED ORDER — LIDOCAINE HCL (PF) 1 % IJ SOLN
30.0000 mL | INTRAMUSCULAR | Status: DC | PRN
  Filled 2017-03-23: qty 30

## 2017-03-23 MED ORDER — TETANUS-DIPHTH-ACELL PERTUSSIS 5-2.5-18.5 LF-MCG/0.5 IM SUSP: 0.5000 mL | Freq: Once | INTRAMUSCULAR | Status: DC

## 2017-03-23 MED ORDER — PHENYLEPHRINE 40 MCG/ML (10ML) SYRINGE FOR IV PUSH (FOR BLOOD PRESSURE SUPPORT)
80.0000 ug | PREFILLED_SYRINGE | INTRAVENOUS | Status: DC | PRN
  Filled 2017-03-23: qty 10

## 2017-03-23 MED ORDER — LIDOCAINE HCL (PF) 1 % IJ SOLN
INTRAMUSCULAR | Status: DC | PRN
  Administered 2017-03-23 (×2): 6 mL via EPIDURAL

## 2017-03-23 MED ORDER — OXYCODONE-ACETAMINOPHEN 5-325 MG PO TABS
2.0000 | ORAL_TABLET | ORAL | Status: DC | PRN
  Administered 2017-03-24: 2 via ORAL
  Filled 2017-03-23: qty 2

## 2017-03-23 MED ORDER — MIDAZOLAM HCL 2 MG/2ML IJ SOLN
INTRAMUSCULAR | Status: AC
  Filled 2017-03-23: qty 2

## 2017-03-23 MED ORDER — COCONUT OIL OIL: 1.0000 "application " | TOPICAL_OIL | Status: DC | PRN

## 2017-03-23 MED ORDER — HYDROMORPHONE HCL 1 MG/ML IJ SOLN
INTRAMUSCULAR | Status: AC
  Administered 2017-03-23: 0.25 mg via INTRAVENOUS
  Filled 2017-03-23: qty 0.5

## 2017-03-23 MED ORDER — LACTATED RINGERS IV SOLN: 500.0000 mL | Freq: Once | INTRAVENOUS | Status: DC

## 2017-03-23 MED ORDER — LIDOCAINE-EPINEPHRINE (PF) 2 %-1:200000 IJ SOLN
INTRAMUSCULAR | Status: AC
  Filled 2017-03-23: qty 20

## 2017-03-23 MED ORDER — OXYTOCIN 40 UNITS IN LACTATED RINGERS INFUSION - SIMPLE MED
1.0000 m[IU]/min | INTRAVENOUS | Status: DC
  Administered 2017-03-23: 2 m[IU]/min via INTRAVENOUS
  Administered 2017-03-23: 20 m[IU]/min via INTRAVENOUS
  Filled 2017-03-23: qty 1000

## 2017-03-23 MED ORDER — LACTATED RINGERS IV SOLN: 500.0000 mL | INTRAVENOUS | Status: DC | PRN

## 2017-03-23 MED ORDER — KETOROLAC TROMETHAMINE 30 MG/ML IJ SOLN
30.0000 mg | Freq: Once | INTRAMUSCULAR | Status: AC
  Administered 2017-03-23: 30 mg via INTRAVENOUS

## 2017-03-23 MED ORDER — ONDANSETRON HCL 4 MG/2ML IJ SOLN: 4.0000 mg | INTRAMUSCULAR | Status: DC | PRN

## 2017-03-23 MED ORDER — BENZOCAINE-MENTHOL 20-0.5 % EX AERO: 1.0000 "application " | INHALATION_SPRAY | CUTANEOUS | Status: DC | PRN

## 2017-03-23 MED ORDER — OXYTOCIN 40 UNITS IN LACTATED RINGERS INFUSION - SIMPLE MED
2.5000 [IU]/h | INTRAVENOUS | Status: DC
  Filled 2017-03-23: qty 1000

## 2017-03-23 MED ORDER — FENTANYL CITRATE (PF) 100 MCG/2ML IJ SOLN
INTRAMUSCULAR | Status: DC | PRN
  Administered 2017-03-23: 100 ug via INTRAVENOUS

## 2017-03-23 MED ORDER — SOD CITRATE-CITRIC ACID 500-334 MG/5ML PO SOLN: 30.0000 mL | ORAL | Status: DC | PRN

## 2017-03-23 MED ORDER — ZOLPIDEM TARTRATE 5 MG PO TABS: 5.0000 mg | ORAL_TABLET | Freq: Every evening | ORAL | Status: DC | PRN

## 2017-03-23 MED ORDER — IBUPROFEN 600 MG PO TABS
600.0000 mg | ORAL_TABLET | Freq: Four times a day (QID) | ORAL | Status: DC
  Administered 2017-03-24 (×3): 600 mg via ORAL
  Filled 2017-03-23 (×3): qty 1

## 2017-03-23 MED ORDER — BUPIVACAINE HCL (PF) 0.25 % IJ SOLN
INTRAMUSCULAR | Status: AC
  Filled 2017-03-23: qty 30

## 2017-03-23 SURGICAL SUPPLY — 26 items
ADH SKN CLS APL DERMABOND .7 (GAUZE/BANDAGES/DRESSINGS) ×1
CLIP FILSHIE TUBAL LIGA STRL (Clip) IMPLANT
CLOTH BEACON ORANGE TIMEOUT ST (SAFETY) ×2 IMPLANT
CONTAINER PREFILL 10% NBF 15ML (MISCELLANEOUS) IMPLANT
DERMABOND ADVANCED (GAUZE/BANDAGES/DRESSINGS) ×1
DERMABOND ADVANCED .7 DNX12 (GAUZE/BANDAGES/DRESSINGS) ×1 IMPLANT
DRSG OPSITE POSTOP 3X4 (GAUZE/BANDAGES/DRESSINGS) ×2 IMPLANT
DURAPREP 26ML APPLICATOR (WOUND CARE) ×2 IMPLANT
ELECT REM PT RETURN 9FT ADLT (ELECTROSURGICAL) ×2
ELECTRODE REM PT RTRN 9FT ADLT (ELECTROSURGICAL) ×1 IMPLANT
GLOVE BIO SURGEON STRL SZ 6.5 (GLOVE) ×2 IMPLANT
GLOVE BIOGEL PI IND STRL 7.0 (GLOVE) ×3 IMPLANT
GLOVE BIOGEL PI INDICATOR 7.0 (GLOVE) ×3
GOWN STRL REUS W/TWL LRG LVL3 (GOWN DISPOSABLE) ×4 IMPLANT
NEEDLE HYPO 22GX1.5 SAFETY (NEEDLE) ×2 IMPLANT
NS IRRIG 1000ML POUR BTL (IV SOLUTION) ×2 IMPLANT
PACK ABDOMINAL MINOR (CUSTOM PROCEDURE TRAY) ×2 IMPLANT
PENCIL BUTTON HOLSTER BLD 10FT (ELECTRODE) ×2 IMPLANT
PROTECTOR NERVE ULNAR (MISCELLANEOUS) ×2 IMPLANT
SPONGE LAP 4X18 X RAY DECT (DISPOSABLE) ×2 IMPLANT
SUT MON AB 4-0 PS1 27 (SUTURE) ×2 IMPLANT
SUT PLAIN 0 NONE (SUTURE) ×1 IMPLANT
SUT VICRYL 0 UR6 27IN ABS (SUTURE) ×2 IMPLANT
SYR CONTROL 10ML LL (SYRINGE) ×2 IMPLANT
TOWEL OR 17X24 6PK STRL BLUE (TOWEL DISPOSABLE) ×4 IMPLANT
TRAY FOLEY CATH SILVER 14FR (SET/KITS/TRAYS/PACK) ×2 IMPLANT

## 2017-03-23 NOTE — Transfer of Care (Signed)
Immediate Anesthesia Transfer of Care Note  Patient: Misty Brown  Procedure(s) Performed: Procedure(s): POST PARTUM TUBAL LIGATION (N/A)  Patient Location: PACU  Anesthesia Type:Epidural  Level of Consciousness: awake, alert  and oriented  Airway & Oxygen Therapy: Patient Spontanous Breathing  Post-op Assessment: Report given to RN and Post -op Vital signs reviewed and stable  Post vital signs: Reviewed and stable  Last Vitals:  Vitals:   03/23/17 2000 03/23/17 2017  BP: 130/66 122/71  Pulse: 78 79  Resp:    Temp:      Last Pain:  Vitals:   03/23/17 1850  TempSrc:   PainSc: 0-No pain         Complications: No apparent anesthesia complications

## 2017-03-23 NOTE — Anesthesia Preprocedure Evaluation (Signed)
Anesthesia Evaluation  Patient identified by MRN, date of birth, ID band Patient awake    Reviewed: Allergy & Precautions, NPO status , Patient's Chart, lab work & pertinent test results  History of Anesthesia Complications Negative for: history of anesthetic complications  Airway Mallampati: II  TM Distance: >3 FB Neck ROM: Full    Dental  (+) Dental Advisory Given   Pulmonary neg pulmonary ROS,    breath sounds clear to auscultation       Cardiovascular hypertension (pregnancy induced, no meds),  Rhythm:Regular Rate:Normal     Neuro/Psych negative neurological ROS     GI/Hepatic Neg liver ROS, GERD  Poorly Controlled,  Endo/Other  negative endocrine ROSdiabetes  Renal/GU negative Renal ROS     Musculoskeletal   Abdominal (+) + obese,   Peds  Hematology plt 215k   Anesthesia Other Findings   Reproductive/Obstetrics                             Anesthesia Physical  Anesthesia Plan  ASA: II  Anesthesia Plan: Epidural   Post-op Pain Management:    Induction:   PONV Risk Score and Plan:   Airway Management Planned: Natural Airway  Additional Equipment:   Intra-op Plan:   Post-operative Plan:   Informed Consent: I have reviewed the patients History and Physical, chart, labs and discussed the procedure including the risks, benefits and alternatives for the proposed anesthesia with the patient or authorized representative who has indicated his/her understanding and acceptance.   Dental advisory given  Plan Discussed with: Anesthesiologist  Anesthesia Plan Comments:         Anesthesia Quick Evaluation

## 2017-03-23 NOTE — H&P (Signed)
Misty Brown is a 36 y.o. female G3P2002 at 38 weeks 4 days presenting for scheduled induction of labor for h/o chronic Hypertension.  Her BPs up until the past week have been within normal limits.  Last week they were mild range.  Patient denies headache, no blurry vision, no scotomata, no RUQ pain.  She has notable decrease in fetal movement the past several days.  She denies CTX, no LOF, no vaginal bleeding.   OB History    Gravida Para Term Preterm AB Living   3 2 2     2    SAB TAB Ectopic Multiple Live Births         0 2     Past Medical History:  Diagnosis Date   Gestational diabetes - last pregnancy (not current pregnancy)   . Essential hypertension    Past Surgical History:  Procedure Laterality Date  . NO PAST SURGERIES    . WISDOM TOOTH EXTRACTION     Family History: family history includes Heart disease in her maternal grandfather and maternal grandmother; Hypertension in her mother; Stroke in her paternal grandmother. Social History:  reports that she has never smoked. She has never used smokeless tobacco. She reports that she does not drink alcohol or use drugs.     Maternal Diabetes: No Genetic Screening: Normal Maternal Ultrasounds/Referrals: Normal Fetal Ultrasounds or other Referrals:  Other: Anatomy scan normal Maternal Substance Abuse:  No Significant Maternal Medications:  None Significant Maternal Lab Results:  Lab values include: Group B Strep negative, Rh negative Other Comments:  None  ROS History Dilation: 1.5 Effacement (%): 60 Station: Ballotable Exam by:: Dr. Mora ApplPinn Blood pressure (!) 162/83, pulse 79, temperature 99.2 F (37.3 C), temperature source Oral, resp. rate 17, height 5\' 9"  (1.753 m), weight 118.8 kg (262 lb), last menstrual period 06/26/2016, unknown if currently breastfeeding. Exam Physical Exam  Prenatal labs: ABO, Rh: --/--/O NEG (06/25 0725) Antibody: NEG (06/25 0725) Rubella: Immune (11/28 0000) RPR: Nonreactive (11/28  0000)  HBsAg: Negative (11/28 0000)  HIV: Non-reactive (11/28 0000)  GBS: Negative (06/06 0000)   Assessment/Plan: 36 yo G3P2002 at 38 weeks 4 days with Chronic HTN Admit to L&D Pitocin for induction Will AROM once head is engaged Epidural on demand BPs elevated, will watch for s/sx of pre e    Wynonia HazardINN, Jacklyne Baik STACIA 03/23/2017, 9:37 AM

## 2017-03-23 NOTE — Anesthesia Procedure Notes (Signed)
Epidural Patient location during procedure: OB Start time: 03/23/2017 2:10 PM End time: 03/23/2017 2:34 PM  Staffing Anesthesiologist: Jairo BenJACKSON, Micki Cassel Performed: anesthesiologist   Preanesthetic Checklist Completed: patient identified, surgical consent, pre-op evaluation, timeout performed, IV checked, risks and benefits discussed and monitors and equipment checked  Epidural Patient position: sitting Prep: site prepped and draped and DuraPrep Patient monitoring: blood pressure, continuous pulse ox and heart rate Approach: midline Location: L3-L4 Injection technique: LOR air  Needle:  Needle type: Tuohy  Needle gauge: 17 G Needle length: 9 cm Needle insertion depth: 6 cm Catheter type: closed end flexible Catheter size: 19 Gauge Catheter at skin depth: 12 cm Test dose: negative (1% lidocaine)  Assessment Events: blood not aspirated, injection not painful, no injection resistance, negative IV test and no paresthesia  Additional Notes Pt identified in Labor room.  Monitors applied. Working IV access confirmed. Sterile prep, drape lumbar spine.  1% lido local L 3,4.  #17ga Touhy LOR air at 6 cm L 3,4, cath in easily to 12 cm skin. Test dose OK, cath dosed and infusion begun.  Patient asymptomatic, VSS, no heme aspirated, tolerated well.  Sandford Craze Anntionette Madkins, MDReason for block:procedure for pain

## 2017-03-23 NOTE — Anesthesia Procedure Notes (Signed)
Procedure Name: MAC Date/Time: 03/23/2017 9:00 PM Performed by: [employer], Sheron Nightingale Pre-anesthesia Checklist: Patient identified, Emergency Drugs available, Suction available, Patient being monitored and Timeout performed Oxygen Delivery Method: Nasal cannula

## 2017-03-23 NOTE — Op Note (Signed)
OPERATIVE NOTE  Misty MamChristy Brown  DOB:    12/24/1980  MRN:    [id number]  CSN:    [id number]  Date of Surgery:  03/23/2017   Preoperative Diagnosis: Desired Sterilization  Postoperative Diagnosis: Same as above  Procedure:  Post partum tubal ligation via Barnett AbuPomeroy method  Surgeon:  Delila SpenceWalda S. Mora ApplPinn, M.D.  Assistant: None  Anesthetic: Local, Epidural    Fluids: 900 mL LR UOP: 100 mL  Catheter: foley EBL: 25 mL Complications: None  Indication:  Misty Brown is a 36 y.o. year old female,who desires permanent sterilization. Risks, benefits, alternatives and indication was explained to patient prior to the procedure.   Findings:  After adequate level of epidural was determined for surgery the patient was prepped and draped in the usual sterile fashion.  The skin was injected with 0.25% Marcaine and a 1.5 cm subumbilical incision was performed in transverse fashion using a 15 blade scalpel. The fascia was entered sharply using Mayo scissors and the peritoneum identified tented up and easily entered with Metzenbaum scissors. The fundus of the uterus was easily located with the surgeon's fingers through the incision and the right fallopian tube was swept into view so that the midportion of the tube could be grasped with Babcock clamps and delivered. As the loop was held up, the fallopian tube was traced down to the fimbriated end to ensure that this was the fallopian tube. The midportion of the fallopian tube was held with the Babcock clamp and a window into the mesosalpinx was made with the bovie. The tube was ligated twice with two free tie sutures of 0-plain catgut. The loop was cut away with Metzenbaum scissors and hemostasis was performed using electrocautery.  The tubal segment was handed off to be sent to Pathology. The process was repeated on the opposite side, grasping the midportion of the left fallopian tube and the fallopian tube was traced to its fimbriated end. The midportion was  held with a babcock clamps and a window into the mesosalpinx was made with the bovie. The tube was ligated twice with two free tie sutures of 0-plain gut. The loop was cut away with Metzenbaum scissors and the tubal edges cauterized with the bovie. There was some bleeding from the fimbriated end so it was clamped with a Kelley clamp and suture ligated with 0-plain gut.  Hemostasis was achieved.  0.25% Marcaine was drizzled over the tubal stumps.  The fascia was reapproximated with 2-0 vicryl on a UR-6. The skin was reapproximated with subcuticular stitch of 4-0 monocryl. The patient's incision was cleaned and dermabond was placed.  A honeycomb op site was placed over the incision.The patient tolerated the procedure well and was sent to the PACU in good condition. All instrument, sponge and needle counts were correct x 3.  Tene Gato STACIA

## 2017-03-23 NOTE — Anesthesia Preprocedure Evaluation (Signed)
Anesthesia Evaluation  Patient identified by MRN, date of birth, ID band Patient awake    Reviewed: Allergy & Precautions, NPO status , Patient's Chart, lab work & pertinent test results  History of Anesthesia Complications Negative for: history of anesthetic complications  Airway Mallampati: II  TM Distance: >3 FB Neck ROM: Full    Dental  (+) Dental Advisory Given   Pulmonary neg pulmonary ROS,    breath sounds clear to auscultation       Cardiovascular hypertension (pregnancy induced, no meds),  Rhythm:Regular Rate:Normal     Neuro/Psych negative neurological ROS     GI/Hepatic Neg liver ROS, GERD  Poorly Controlled,  Endo/Other  negative endocrine ROSneg diabetes  Renal/GU negative Renal ROS     Musculoskeletal   Abdominal (+) + obese,   Peds  Hematology plt 215k   Anesthesia Other Findings   Reproductive/Obstetrics (+) Pregnancy                             Anesthesia Physical Anesthesia Plan  ASA: II  Anesthesia Plan: Epidural   Post-op Pain Management:    Induction:   PONV Risk Score and Plan:   Airway Management Planned: Natural Airway  Additional Equipment:   Intra-op Plan:   Post-operative Plan:   Informed Consent: I have reviewed the patients History and Physical, chart, labs and discussed the procedure including the risks, benefits and alternatives for the proposed anesthesia with the patient or authorized representative who has indicated his/her understanding and acceptance.   Dental advisory given  Plan Discussed with:   Anesthesia Plan Comments: (Patient identified. Risks/Benefits/Options discussed with patient including but not limited to bleeding, infection, nerve damage, paralysis, failed block, incomplete pain control, headache, blood pressure changes, nausea, vomiting, reactions to medication both or allergic, itching and postpartum back pain. Confirmed  with bedside nurse the patient's most recent platelet count. Confirmed with patient that they are not currently taking any anticoagulation, have any bleeding history or any family history of bleeding disorders. Patient expressed understanding and wished to proceed. All questions were answered. )        Anesthesia Quick Evaluation

## 2017-03-23 NOTE — Anesthesia Postprocedure Evaluation (Signed)
Anesthesia Post Note  Patient: Misty Brown  Procedure(s) Performed: Procedure(s) (LRB): POST PARTUM TUBAL LIGATION (N/A)     Patient location during evaluation: PACU Anesthesia Type: Epidural Level of consciousness: oriented and awake and alert Pain management: pain level controlled Vital Signs Assessment: post-procedure vital signs reviewed and stable Respiratory status: spontaneous breathing, respiratory function stable and patient connected to nasal cannula oxygen Cardiovascular status: blood pressure returned to baseline and stable Postop Assessment: no headache and no backache Anesthetic complications: no    Last Vitals:  Vitals:   03/23/17 2230 03/23/17 2245  BP: 140/81 (!) 156/79  Pulse: 78 85  Resp: 20 20  Temp:      Last Pain:  Vitals:   03/23/17 2217  TempSrc: Oral  PainSc:    Pain Goal:                 Remonia RichterSINGER,Elmer Merwin DANIEL

## 2017-03-23 NOTE — Anesthesia Pain Management Evaluation Note (Signed)
  CRNA Pain Management Visit Note  Patient: Misty Brown, 36 y.o., female  "Hello I am a [facility] of the anesthesia [facility] at [facility]. We have an anesthesia team available at all times to provide care throughout the hospital, including epidural management and anesthesia for C-section. I don't know your plan for the delivery whether it a natural birth, water birth, IV sedation, nitrous supplementation, doula or epidural, but we want to meet your pain goals."   1.Was your pain managed to your expectations on prior hospitalizations?   Yes   2.What is your expectation for pain management during this hospitalization?     Epidural  3.How can we help you reach that goal?   Record the patient's initial score and the patient's pain goal.   Pain: 0  Pain Goal: 3 [facility] wants you to be able to say your pain was always managed very well.  Laban EmperorMalinova,Hibba Schram Hristova 03/23/2017

## 2017-03-24 ENCOUNTER — Encounter (HOSPITAL_COMMUNITY): Payer: Self-pay | Admitting: Obstetrics & Gynecology

## 2017-03-24 LAB — CBC
HEMATOCRIT: 32.5 % — AB (ref 36.0–46.0)
HEMOGLOBIN: 10.5 g/dL — AB (ref 12.0–15.0)
MCH: 24.4 pg — ABNORMAL LOW (ref 26.0–34.0)
MCHC: 32.3 g/dL (ref 30.0–36.0)
MCV: 75.4 fL — ABNORMAL LOW (ref 78.0–100.0)
Platelets: 212 10*3/uL (ref 150–400)
RBC: 4.31 MIL/uL (ref 3.87–5.11)
RDW: 15 % (ref 11.5–15.5)
WBC: 11.1 10*3/uL — AB (ref 4.0–10.5)

## 2017-03-24 MED ORDER — IBUPROFEN 600 MG PO TABS: 600.0000 mg | ORAL_TABLET | Freq: Four times a day (QID) | ORAL | 0 refills | Status: DC

## 2017-03-24 NOTE — Anesthesia Postprocedure Evaluation (Signed)
Anesthesia Post Note  Patient: Misty Brown  Procedure(s) Performed: * No procedures listed *     Patient location during evaluation: Mother Baby Anesthesia Type: Epidural Level of consciousness: awake and alert Pain management: pain level controlled Vital Signs Assessment: post-procedure vital signs reviewed and stable Respiratory status: spontaneous breathing, nonlabored ventilation and respiratory function stable Cardiovascular status: stable Postop Assessment: no headache, no backache and epidural receding Anesthetic complications: no    Last Vitals:  Vitals:   03/24/17 0040 03/24/17 0426  BP: (!) 147/69 129/60  Pulse: 88 74  Resp: 20   Temp: 37 C 36.9 C    Last Pain:  Vitals:   03/24/17 0620  TempSrc:   PainSc: 2    Pain Goal: Patients Stated Pain Goal: 3 (03/23/17 2300)               [name],[name]

## 2017-03-24 NOTE — Progress Notes (Signed)
POD#1 Pt is doing well . She has light bleeding. ABd feels fine. Would like to go home.  VSSAF PE: VSSAF IMP/ stable Plan/ Will discharge to home

## 2017-03-24 NOTE — Discharge Summary (Signed)
Obstetric Discharge Summary Reason for Admission: induction of labor Prenatal Procedures: NST and ultrasound Intrapartum Procedures: spontaneous vaginal delivery Postpartum Procedures: P.P. tubal ligation Complications-Operative and Postpartum: 1st degree perineal laceration Hemoglobin  Date Value Ref Range Status  03/24/2017 10.5 (L) 12.0 - 15.0 g/dL Final   HCT  Date Value Ref Range Status  03/24/2017 32.5 (L) 36.0 - 46.0 % Final    Physical Exam:  General: alert and cooperative Lochia: appropriate Uterine Fundus: firm   Discharge Diagnoses: Term Pregnancy-delivered and hypertension  Discharge Information: Date: 03/24/2017 Activity: pelvic rest Diet: routine Medications: PNV, Ibuprofen and Percocet Condition: stable Instructions: refer to practice specific booklet Discharge to: home Follow-up Information    Essie HartPinn, Walda, MD. Schedule an appointment as soon as possible for a visit in 4 week(s).   Specialty:  Obstetrics and Gynecology Contact information: [address] [place] [address] [phone]           Newborn Data: Live born female  Birth Weight: 8 lb 8.2 oz (3861 g) APGAR: 9, 9  Home with mother.  ANDERSON,MARK E 03/24/2017, 3:54 PM

## 2017-03-25 DIAGNOSIS — Z00110 Health examination for newborn under 8 days old: Secondary | ICD-10-CM | POA: Diagnosis not present

## 2017-03-27 ENCOUNTER — Inpatient Hospital Stay (HOSPITAL_COMMUNITY): Admission: RE | Admit: 2017-03-27 | Payer: BLUE CROSS/BLUE SHIELD | Source: Ambulatory Visit

## 2017-05-28 ENCOUNTER — Other Ambulatory Visit: Payer: Self-pay | Admitting: Obstetrics & Gynecology

## 2017-05-28 ENCOUNTER — Ambulatory Visit
Admission: RE | Admit: 2017-05-28 | Discharge: 2017-05-28 | Disposition: A | Discharge status: Home / Self Care | Payer: BLUE CROSS/BLUE SHIELD | Source: Ambulatory Visit | Attending: Obstetrics & Gynecology | Admitting: Obstetrics & Gynecology

## 2017-05-28 DIAGNOSIS — Z975 Presence of (intrauterine) contraceptive device: Secondary | ICD-10-CM

## 2017-05-28 DIAGNOSIS — T193XXA Foreign body in uterus, initial encounter: Secondary | ICD-10-CM | POA: Diagnosis not present

## 2017-06-03 ENCOUNTER — Other Ambulatory Visit: Payer: Self-pay | Admitting: Obstetrics & Gynecology

## 2017-06-03 DIAGNOSIS — Z975 Presence of (intrauterine) contraceptive device: Secondary | ICD-10-CM

## 2017-06-08 ENCOUNTER — Ambulatory Visit
Admission: RE | Admit: 2017-06-08 | Discharge: 2017-06-08 | Disposition: A | Discharge status: Home / Self Care | Payer: BLUE CROSS/BLUE SHIELD | Source: Ambulatory Visit | Attending: Obstetrics & Gynecology | Admitting: Obstetrics & Gynecology

## 2017-06-08 DIAGNOSIS — T8332XA Displacement of intrauterine contraceptive device, initial encounter: Secondary | ICD-10-CM | POA: Diagnosis not present

## 2017-06-08 DIAGNOSIS — Z975 Presence of (intrauterine) contraceptive device: Secondary | ICD-10-CM

## 2017-06-15 ENCOUNTER — Other Ambulatory Visit: Payer: Self-pay | Admitting: Obstetrics & Gynecology

## 2017-06-15 DIAGNOSIS — Z975 Presence of (intrauterine) contraceptive device: Secondary | ICD-10-CM

## 2017-06-18 ENCOUNTER — Ambulatory Visit
Admission: RE | Admit: 2017-06-18 | Discharge: 2017-06-18 | Disposition: A | Discharge status: Home / Self Care | Payer: BLUE CROSS/BLUE SHIELD | Source: Ambulatory Visit | Attending: Obstetrics & Gynecology | Admitting: Obstetrics & Gynecology

## 2017-06-18 DIAGNOSIS — Z975 Presence of (intrauterine) contraceptive device: Secondary | ICD-10-CM

## 2017-06-18 DIAGNOSIS — T8332XA Displacement of intrauterine contraceptive device, initial encounter: Secondary | ICD-10-CM | POA: Diagnosis not present

## 2017-08-28 DIAGNOSIS — Z124 Encounter for screening for malignant neoplasm of cervix: Secondary | ICD-10-CM | POA: Diagnosis not present

## 2017-08-28 DIAGNOSIS — Z6832 Body mass index (BMI) 32.0-32.9, adult: Secondary | ICD-10-CM | POA: Diagnosis not present

## 2017-08-28 DIAGNOSIS — Z01419 Encounter for gynecological examination (general) (routine) without abnormal findings: Secondary | ICD-10-CM | POA: Diagnosis not present

## 2017-10-01 DIAGNOSIS — Z30430 Encounter for insertion of intrauterine contraceptive device: Secondary | ICD-10-CM | POA: Diagnosis not present

## 2017-10-01 DIAGNOSIS — R102 Pelvic and perineal pain: Secondary | ICD-10-CM | POA: Diagnosis not present

## 2017-10-29 ENCOUNTER — Telehealth: Payer: Self-pay | Admitting: Family Medicine

## 2017-10-29 MED ORDER — OSELTAMIVIR PHOSPHATE 75 MG PO CAPS: 75.0000 mg | ORAL_CAPSULE | Freq: Every day | ORAL | 0 refills | Status: DC

## 2017-10-29 NOTE — Telephone Encounter (Signed)
Pt aware and is grateful/thx dmf

## 2017-10-29 NOTE — Telephone Encounter (Signed)
Noted and I hope her child feels better soon.  eRx for Tamiflu prophylaxis sent to pharmacy on file.

## 2017-10-29 NOTE — Telephone Encounter (Signed)
TA-Plz see pt request below/thx dmf

## 2017-10-29 NOTE — Telephone Encounter (Signed)
Copied from CRM [phone]. Topic: Inquiry >> Oct 29, 2017 11:27 AM Windy KalataMichael, Malin Sambrano L, NT wrote: Patient is calling stating her child was diagnosed with the flu on 10/27/17 and pediatrician told her to contact her PCP to get tamiflu. Pt is not experiencing any symptoms at this time. Please advise.   [facility] [address]

## 2018-01-07 ENCOUNTER — Encounter: Payer: Self-pay | Admitting: Primary Care

## 2018-01-07 ENCOUNTER — Ambulatory Visit: Payer: BLUE CROSS/BLUE SHIELD | Admitting: Primary Care

## 2018-01-07 ENCOUNTER — Encounter (INDEPENDENT_AMBULATORY_CARE_PROVIDER_SITE_OTHER): Payer: Self-pay

## 2018-01-07 VITALS — BP 124/76 | HR 71 | Temp 98.0°F | Ht 69.0 in | Wt 230.0 lb

## 2018-01-07 DIAGNOSIS — F329 Major depressive disorder, single episode, unspecified: Secondary | ICD-10-CM | POA: Diagnosis not present

## 2018-01-07 DIAGNOSIS — F32A Depression, unspecified: Secondary | ICD-10-CM

## 2018-01-07 DIAGNOSIS — F419 Anxiety disorder, unspecified: Secondary | ICD-10-CM

## 2018-01-07 MED ORDER — FLUOXETINE HCL 10 MG PO TABS: 10.0000 mg | ORAL_TABLET | Freq: Every day | ORAL | 1 refills | Status: DC

## 2018-01-07 NOTE — Patient Instructions (Signed)
Start fluoxetine 10 mg tablets every morning.   Please call me if you have any problems as discussed.  Please schedule a follow up visit in 6 weeks for re-evaluation.  It was a pleasure meeting you!

## 2018-01-07 NOTE — Progress Notes (Signed)
Subjective:    Patient ID: Misty Brown, female    DOB: Oct 10, 1980, 37 y.o.   MRN: [id number]  HPI  Misty Brown is a 37 year old female who presents today to transfer from Dr. Dayton Martes and for a chief complaint of anxiety.   Symptoms of anxiety and depression off and on for the past 6 months. She's under a lot of stress as she works full time, has three children under then age of 45, and takes care of the inside and outside house work. Her youngest child is 42 months old.   When she gets home she'll start to feel like she can't focus on tasks and can't get anything done, feels down, at times feels little motivation to do anything, anxiety about a messy house, tearfulness. She's tried self calming techniques and crying with some improvement.  GAD 7 score of 7 and PHQ 9 score of 5 today. She is interested in trying medication given that her symptoms will wax and wane and her symptoms have been present for 6 months. Her symptoms were worse 2 months ago, but are gradually increasing now. She denies SI/HI.   Review of Systems  Respiratory: Negative for shortness of breath.   Cardiovascular: Negative for chest pain.  Neurological: Negative for dizziness and headaches.  Psychiatric/Behavioral: Negative for sleep disturbance and suicidal ideas.       See HPI       Past Medical History:  Diagnosis Date  . Anxiety and depression   . Elevated blood pressure reading   . Gestational diabetes      Social History   Socioeconomic History  . Marital status: Married    Spouse name: Not on file  . Number of children: Not on file  . Years of education: Not on file  . Highest education level: Not on file  Occupational History  . Not on file  Social Needs  . Financial resource strain: Not on file  . Food insecurity:    Worry: Not on file    Inability: Not on file  . Transportation needs:    Medical: Not on file    Non-medical: Not on file  Tobacco Use  . Smoking status: Never Smoker  .  Smokeless tobacco: Never Used  Substance and Sexual Activity  . Alcohol use: No  . Drug use: No  . Sexual activity: Yes  Lifestyle  . Physical activity:    Days per week: Not on file    Minutes per session: Not on file  . Stress: Not on file  Relationships  . Social connections:    Talks on phone: Not on file    Gets together: Not on file    Attends religious service: Not on file    Active member of club or organization: Not on file    Attends meetings of clubs or organizations: Not on file    Relationship status: Not on file  . Intimate partner violence:    Fear of current or ex partner: Not on file    Emotionally abused: Not on file    Physically abused: Not on file    Forced sexual activity: Not on file  Other Topics Concern  . Not on file  Social History Narrative   Married.   3 children.   Works in [employer].   Enjoys spending time with her family.    Past Surgical History:  Procedure Laterality Date  . TUBAL LIGATION N/A 03/23/2017   Procedure: POST PARTUM TUBAL LIGATION;  Surgeon: Essie HartPinn, Walda, MD;  Location: [facility];  Service: Gynecology;  Laterality: N/A;  . WISDOM TOOTH EXTRACTION      Family History  Problem Relation Age of Onset  . Hypertension Mother   . Hyperlipidemia Mother   . Heart disease Maternal Grandmother   . Heart disease Maternal Grandfather   . Stroke Paternal Grandmother     Allergies  Allergen Reactions  . Dicyclomine     Abdominal pain   . Doxycycline Nausea And Vomiting    Current Outpatient Medications on File Prior to Visit  Medication Sig Dispense Refill  . ibuprofen (ADVIL,MOTRIN) 600 MG tablet Take 1 tablet (600 mg total) by mouth every 6 (six) hours. 30 tablet 0  . Prenatal Vit-Fe Fumarate-FA (PRENATAL VITAMIN PO) Take by mouth.     No current facility-administered medications on file prior to visit.     BP 124/76   Pulse 71   Temp 98 F (36.7 C) (Oral)   Ht 5\' 9"  (1.753 m)   Wt 230 lb (104.3 kg)   LMP 12/10/2017   SpO2  98%   Breastfeeding? No   BMI 33.97 kg/m    Objective:   Physical Exam  Constitutional: She appears well-nourished.  Neck: Neck supple.  Cardiovascular: Normal rate and regular rhythm.  Pulmonary/Chest: Effort normal and breath sounds normal.  Skin: Skin is warm and dry.  Psychiatric: She has a normal mood and affect.          Assessment & Plan:

## 2018-01-08 ENCOUNTER — Telehealth: Payer: Self-pay | Admitting: Primary Care

## 2018-01-08 DIAGNOSIS — F32A Depression, unspecified: Secondary | ICD-10-CM | POA: Insufficient documentation

## 2018-01-08 DIAGNOSIS — F329 Major depressive disorder, single episode, unspecified: Secondary | ICD-10-CM

## 2018-01-08 DIAGNOSIS — F419 Anxiety disorder, unspecified: Principal | ICD-10-CM

## 2018-01-08 MED ORDER — FLUOXETINE HCL 10 MG PO CAPS: 10.0000 mg | ORAL_CAPSULE | Freq: Every day | ORAL | 1 refills | Status: DC

## 2018-01-08 NOTE — Telephone Encounter (Signed)
Copied from CRM [id number]. Topic: Quick Communication - Rx Refill/Question >> Jan 08, 2018 12:28 PM Clack, Princella PellegriniJessica D wrote: Medication: FLUoxetine (PROZAC) 10 MG tablet [[id number]][[id number]]  Has the patient contacted their pharmacy? No. (Agent: If no, request that the patient contact the pharmacy for the refill.) Preferred Pharmacy (with phone number or street name): [id number], [place] [address] [phone] (Phone) [phone] (Fax)  Pt would like to know if the capsules could be called in, b/c they are cheaper.   Agent: Please be advised that RX refills may take up to 3 business days. We ask that you follow-up with your pharmacy.

## 2018-01-08 NOTE — Assessment & Plan Note (Signed)
Present for 6 months. GAD 7 score of 7 and PHQ 9 score of 5.   She declines therapy at this time, would like to try medication.   Rx for fluoxetine 10 mg tablets sent to pharmacy. We discussed possible side effects of headache, GI upset, drowsiness, and SI/HI. If thoughts of SI/HI develop, we discussed to present to the emergency immediately. Patient verbalized understanding.   Follow up in 6 weeks for re-evaluation.

## 2018-01-08 NOTE — Telephone Encounter (Signed)
Spoken and notified patient of Kate Clark's comments. Patient verbalized understanding.  

## 2018-01-08 NOTE — Telephone Encounter (Signed)
Please notify patient that I sent in the capsules. Thanks.

## 2018-02-23 ENCOUNTER — Ambulatory Visit: Payer: BLUE CROSS/BLUE SHIELD | Admitting: Primary Care

## 2018-02-26 ENCOUNTER — Ambulatory Visit: Payer: BLUE CROSS/BLUE SHIELD | Admitting: Primary Care

## 2018-02-28 ENCOUNTER — Other Ambulatory Visit: Payer: Self-pay | Admitting: Primary Care

## 2018-02-28 DIAGNOSIS — F329 Major depressive disorder, single episode, unspecified: Secondary | ICD-10-CM

## 2018-02-28 DIAGNOSIS — F419 Anxiety disorder, unspecified: Principal | ICD-10-CM

## 2018-02-28 DIAGNOSIS — F32A Depression, unspecified: Secondary | ICD-10-CM

## 2018-03-02 ENCOUNTER — Ambulatory Visit: Payer: BLUE CROSS/BLUE SHIELD | Admitting: Primary Care

## 2018-03-02 ENCOUNTER — Encounter: Payer: Self-pay | Admitting: Primary Care

## 2018-03-02 DIAGNOSIS — F32A Depression, unspecified: Secondary | ICD-10-CM

## 2018-03-02 DIAGNOSIS — F419 Anxiety disorder, unspecified: Secondary | ICD-10-CM

## 2018-03-02 DIAGNOSIS — F329 Major depressive disorder, single episode, unspecified: Secondary | ICD-10-CM

## 2018-03-02 MED ORDER — FLUOXETINE HCL 10 MG PO CAPS: 10.0000 mg | ORAL_CAPSULE | Freq: Every day | ORAL | 2 refills | Status: DC

## 2018-03-02 NOTE — Patient Instructions (Signed)
Continue fluoxetine 10 mg capsules as discussed.   Please call me if you need anything.  It was a pleasure to see you today!

## 2018-03-02 NOTE — Progress Notes (Signed)
Subjective:    Patient ID: Misty Brown, female    DOB: 06/02/1981, 37 y.o.   MRN: [id number]  HPI  Misty Brown is a 37 year old female who presents today for follow up of anxiety and depression.  She was last evaluated in early April 2019 with reports of intermittent anxiety and depression over the prior 6 months. She endorsed difficulty with focus, little motivation to do anything, tearfulness, anxiety. GAD 7 score of 7 and PHQ 9 score of 5 during that visit, she kindly declined therapy so we initiated low dose fluoxetine.  Since her last visit she's feeling better. Positive effects of fluoxetine include less binge eating, less anxiety, less worry, increase focus. She does still have some symptoms but these are overall more manageable. She denies SI/HI.   Wt Readings from Last 3 Encounters:  03/02/18 230 lb 4 oz (104.4 kg)  01/07/18 230 lb (104.3 kg)  03/23/17 262 lb (118.8 kg)     Review of Systems  Respiratory: Negative for shortness of breath.   Cardiovascular: Negative for chest pain.  Neurological: Negative for headaches.  Psychiatric/Behavioral: Negative for suicidal ideas.       See HPI       Past Medical History:  Diagnosis Date  . Anxiety and depression   . Elevated blood pressure reading   . Gestational diabetes      Social History   Socioeconomic History  . Marital status: Married    Spouse name: Not on file  . Number of children: Not on file  . Years of education: Not on file  . Highest education level: Not on file  Occupational History  . Not on file  Social Needs  . Financial resource strain: Not on file  . Food insecurity:    Worry: Not on file    Inability: Not on file  . Transportation needs:    Medical: Not on file    Non-medical: Not on file  Tobacco Use  . Smoking status: Never Smoker  . Smokeless tobacco: Never Used  Substance and Sexual Activity  . Alcohol use: No  . Drug use: No  . Sexual activity: Yes  Lifestyle  . Physical  activity:    Days per week: Not on file    Minutes per session: Not on file  . Stress: Not on file  Relationships  . Social connections:    Talks on phone: Not on file    Gets together: Not on file    Attends religious service: Not on file    Active member of club or organization: Not on file    Attends meetings of clubs or organizations: Not on file    Relationship status: Not on file  . Intimate partner violence:    Fear of current or ex partner: Not on file    Emotionally abused: Not on file    Physically abused: Not on file    Forced sexual activity: Not on file  Other Topics Concern  . Not on file  Social History Narrative   Married.   3 children.   Works in [employer].   Enjoys spending time with her family.    Past Surgical History:  Procedure Laterality Date  . TUBAL LIGATION N/A 03/23/2017   Procedure: POST PARTUM TUBAL LIGATION;  Surgeon: Essie HartPinn, Walda, MD;  Location: [facility];  Service: Gynecology;  Laterality: N/A;  . WISDOM TOOTH EXTRACTION      Family History  Problem Relation Age of Onset  . Hypertension  Mother   . Hyperlipidemia Mother   . Heart disease Maternal Grandmother   . Heart disease Maternal Grandfather   . Stroke Paternal Grandmother     Allergies  Allergen Reactions  . Dicyclomine     Abdominal pain   . Doxycycline Nausea And Vomiting    Current Outpatient Medications on File Prior to Visit  Medication Sig Dispense Refill  . Prenatal Vit-Fe Fumarate-FA (PRENATAL VITAMIN PO) Take by mouth.    Marland Kitchen ibuprofen (ADVIL,MOTRIN) 600 MG tablet Take 1 tablet (600 mg total) by mouth every 6 (six) hours. (Patient not taking: Reported on 03/02/2018) 30 tablet 0   No current facility-administered medications on file prior to visit.     BP 124/76   Pulse 91   Temp 98.1 F (36.7 C) (Oral)   Ht 5\' 9"  (1.753 m)   Wt 230 lb 4 oz (104.4 kg)   LMP 02/09/2018   SpO2 98%   BMI 34.00 kg/m    Objective:   Physical Exam  Constitutional: She appears  well-nourished.  Neck: Neck supple.  Cardiovascular: Normal rate and regular rhythm.  Respiratory: Effort normal and breath sounds normal.  Skin: Skin is warm and dry.  Psychiatric: She has a normal mood and affect.  Mood much improved today           Assessment & Plan:

## 2018-03-02 NOTE — Assessment & Plan Note (Signed)
Moderate improvement in mood and symptoms since initiation of fluoxetine. Will continue fluoxetine at 10 mg. Refill sent to pharmacy. She will update if anything changes.

## 2018-04-20 ENCOUNTER — Encounter: Payer: BLUE CROSS/BLUE SHIELD | Admitting: Primary Care

## 2018-05-24 IMAGING — CT CT PELVIS W/O CM
2 of 3 series · 14 of 36 positions shown, 19 images · non-contrast
Comparison: None.

CLINICAL DATA: Missing IUD.

EXAM:
CT PELVIS WITHOUT CONTRAST
TECHNIQUE: Multidetector CT imaging of the pelvis was performed following the
standard protocol without intravenous contrast.

[Series 601: coronal body · coronal · 0.86mm/px · 1 of 119 slices shown, 2 images]
[im 40/119  soft-tissue]
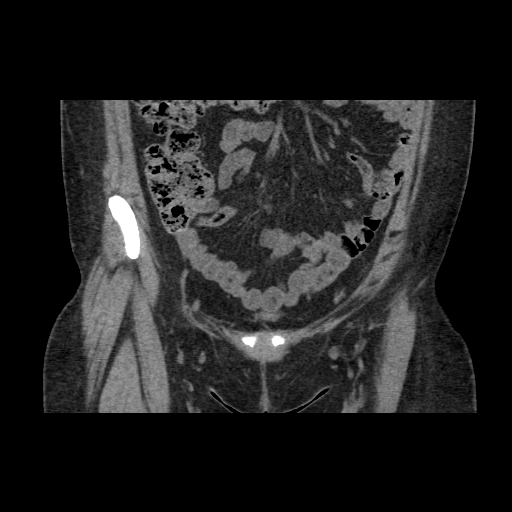
[im 40/119  bone]
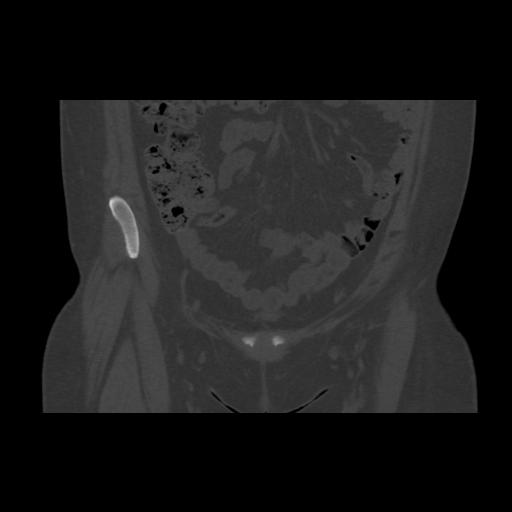

[Series 602: sagittal body · sagittal · 0.86mm/px · 13 of 177 slices shown, 17 images]
[im 8/177  lung]
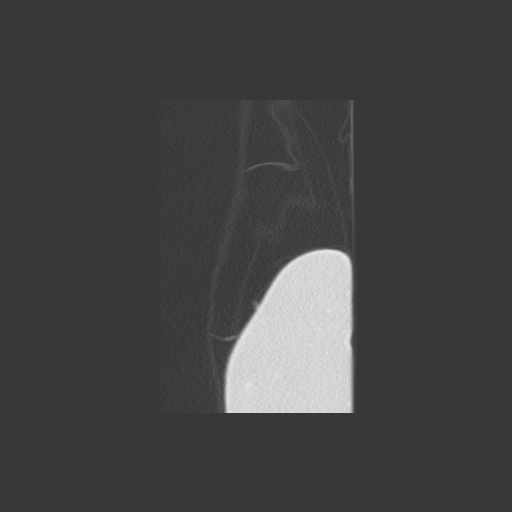
[im 15/177  soft-tissue]
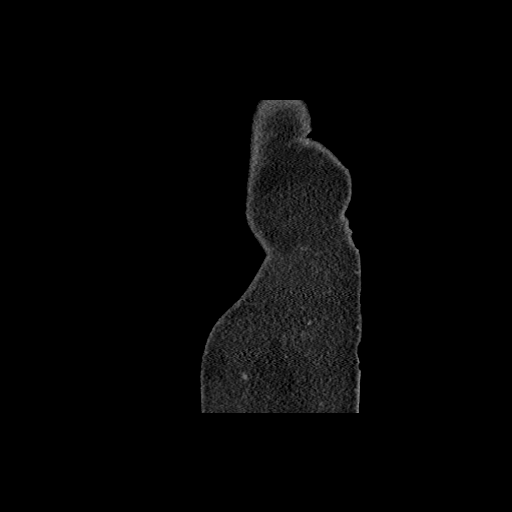
[im 15/177  lung]
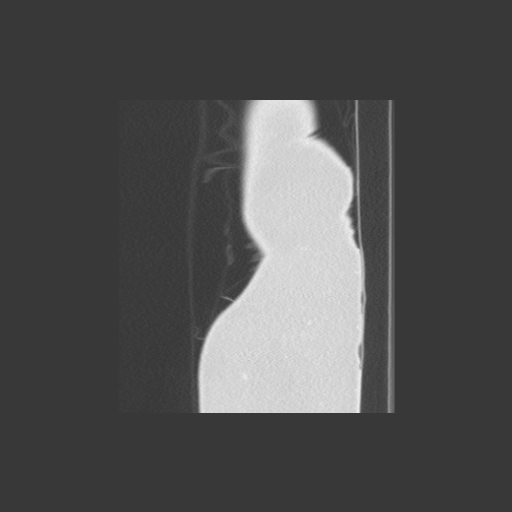
[im 15/177  bone]
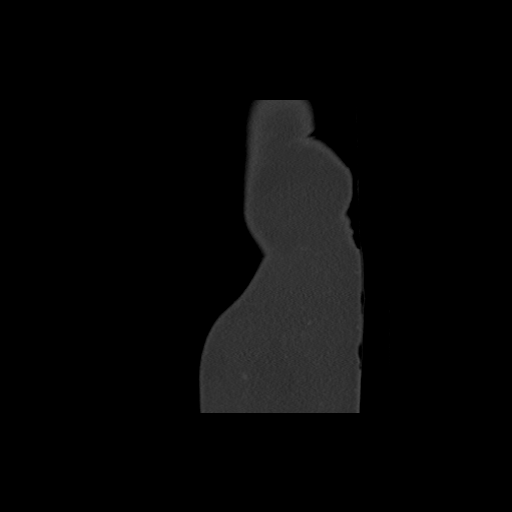
[im 23/177  lung]
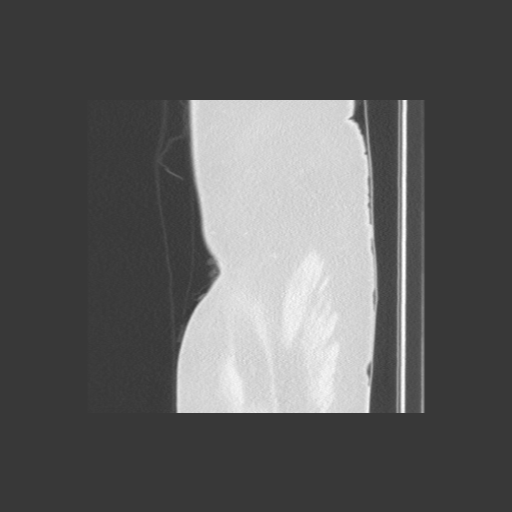
[im 30/177  soft-tissue]
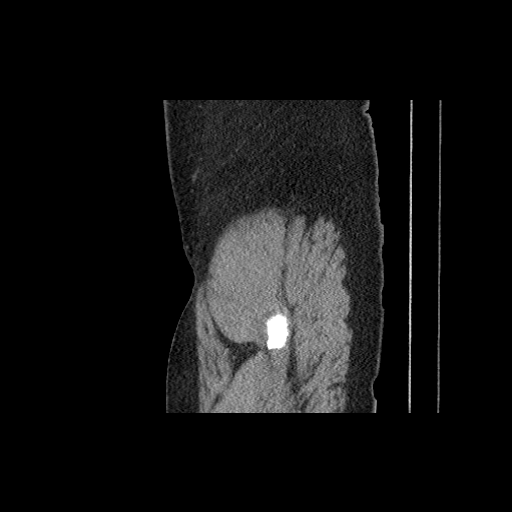
[im 30/177  lung]
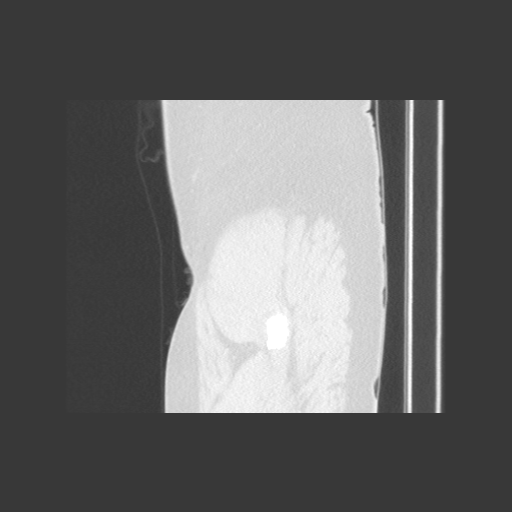
[im 45/177  soft-tissue]
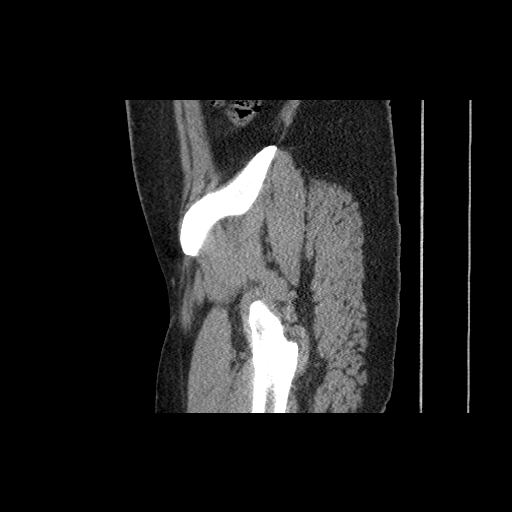
[im 59/177  soft-tissue]
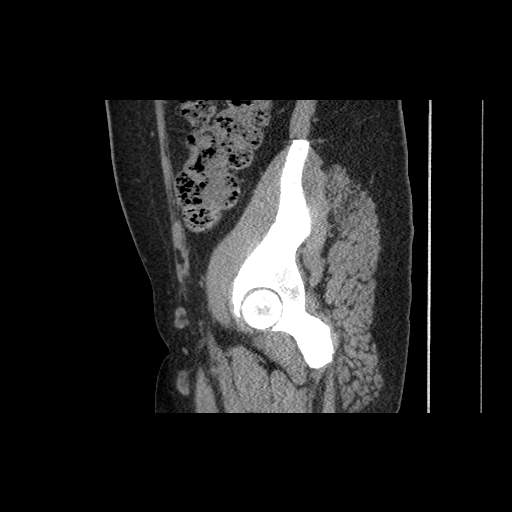
[im 74/177  soft-tissue]
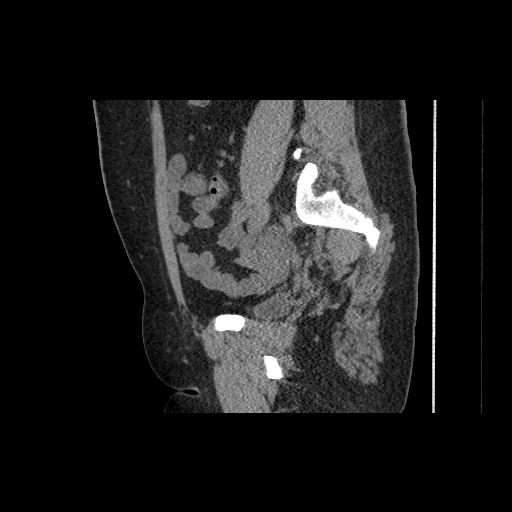
[im 89/177  soft-tissue]
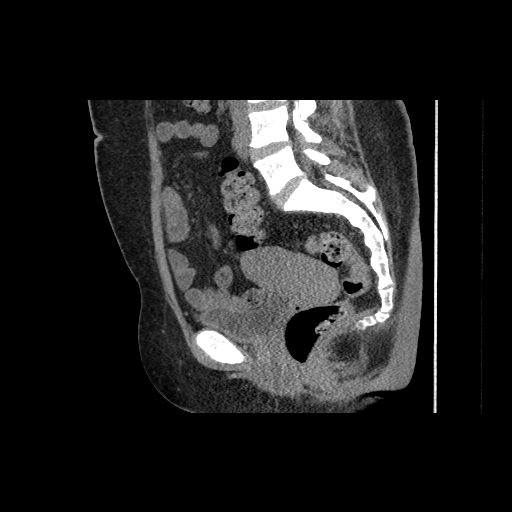
[im 103/177  soft-tissue]
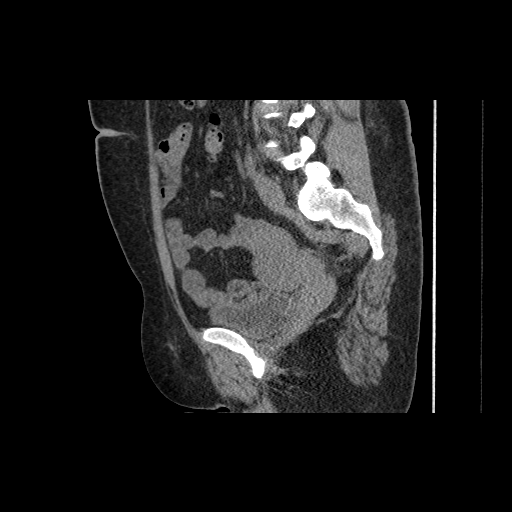
[im 118/177  soft-tissue]
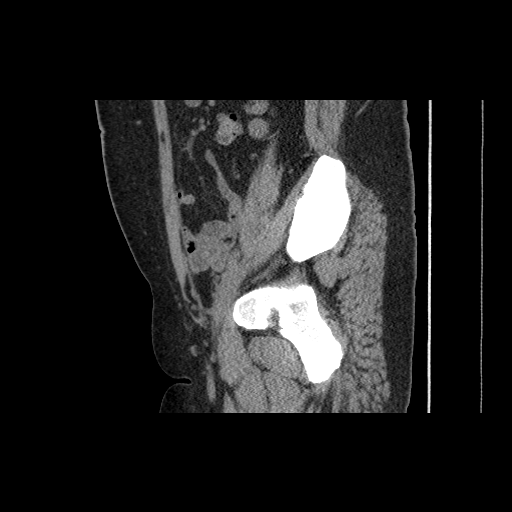
[im 133/177  soft-tissue]
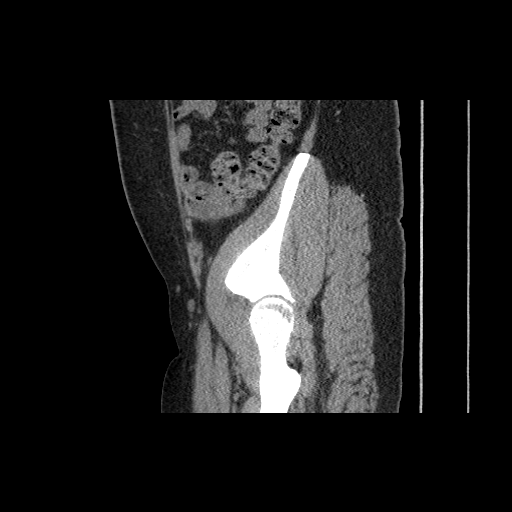
[im 133/177  bone]
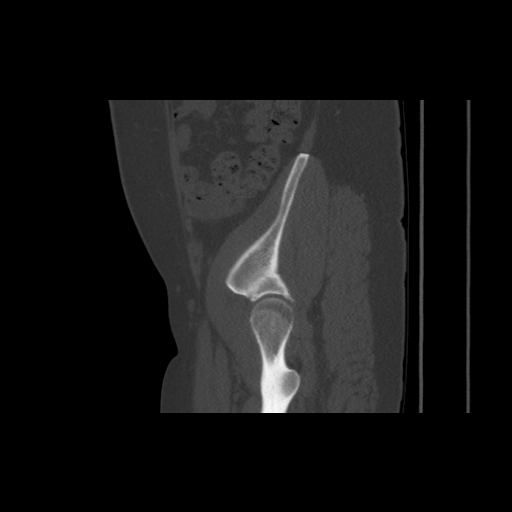
[im 147/177  soft-tissue]
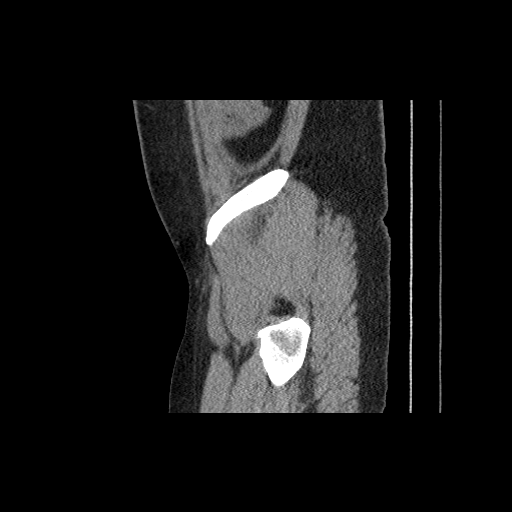
[im 162/177  soft-tissue]
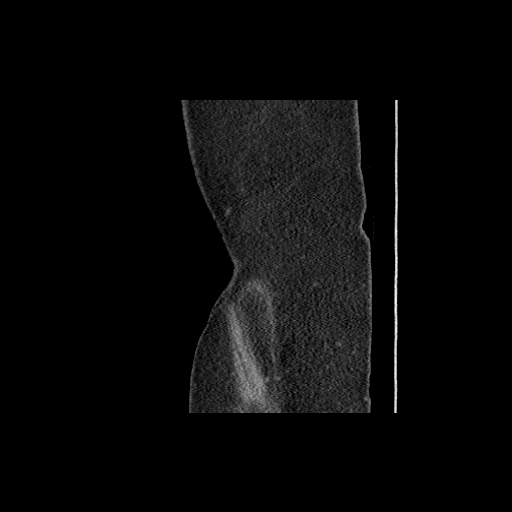

[14 of 36 positions shown; findings below may reference images not displayed]

FINDINGS: Urinary Tract: The urinary bladder appears normal for the degree of
distention.

Bowel:  Unremarkable visualized pelvic bowel loops.

Vascular/Lymphatic: No pathologically enlarged lymph nodes. No
significant vascular abnormality seen.

Reproductive: Uterus unremarkable. No evidence for IUD. There is no
adnexal mass.

Other: No intraperitoneal free fluid. No evidence for IUD within the
peritoneal cavity of the anatomic pelvis.

Musculoskeletal: Bone windows reveal no worrisome lytic or sclerotic
osseous lesions.
IMPRESSION: 1. No IUD visualized in the pelvis. Of note, the entire peritoneal
cavity has not been visualized on this pelvis CT.

## 2018-09-27 DIAGNOSIS — Z01419 Encounter for gynecological examination (general) (routine) without abnormal findings: Secondary | ICD-10-CM | POA: Diagnosis not present

## 2018-09-27 DIAGNOSIS — J00 Acute nasopharyngitis [common cold]: Secondary | ICD-10-CM | POA: Diagnosis not present

## 2018-09-27 DIAGNOSIS — Z124 Encounter for screening for malignant neoplasm of cervix: Secondary | ICD-10-CM | POA: Diagnosis not present

## 2018-10-14 DIAGNOSIS — Z30431 Encounter for routine checking of intrauterine contraceptive device: Secondary | ICD-10-CM | POA: Diagnosis not present

## 2018-10-14 DIAGNOSIS — N939 Abnormal uterine and vaginal bleeding, unspecified: Secondary | ICD-10-CM | POA: Diagnosis not present

## 2018-10-14 DIAGNOSIS — T8332XA Displacement of intrauterine contraceptive device, initial encounter: Secondary | ICD-10-CM | POA: Diagnosis not present

## 2018-10-14 DIAGNOSIS — R102 Pelvic and perineal pain: Secondary | ICD-10-CM | POA: Diagnosis not present

## 2018-12-03 DIAGNOSIS — N939 Abnormal uterine and vaginal bleeding, unspecified: Secondary | ICD-10-CM | POA: Diagnosis not present

## 2019-01-13 DIAGNOSIS — Z09 Encounter for follow-up examination after completed treatment for conditions other than malignant neoplasm: Secondary | ICD-10-CM | POA: Diagnosis not present

## 2019-01-13 DIAGNOSIS — Z789 Other specified health status: Secondary | ICD-10-CM | POA: Diagnosis not present

## 2019-04-28 ENCOUNTER — Telehealth: Payer: Self-pay | Admitting: Primary Care

## 2019-04-28 NOTE — Telephone Encounter (Signed)
Patient stated that she has been dieting and since being on her diet she does not feel her self.  She stated she feels like something is "off" with her body., Patient would like to know if blood work could be ordered to check her levels and make sure that everything looks okay.      Patient's C/B # [phone]

## 2019-04-28 NOTE — Telephone Encounter (Signed)
Message left for patient to return my call.  

## 2019-04-28 NOTE — Telephone Encounter (Signed)
Please notify patient that I am always happy to help we will need to see her in the office for further evaluation.

## 2019-04-29 NOTE — Telephone Encounter (Signed)
Spoken and notified patient of Misty Brown comments. Patient verbalized understanding. Appointment has been schedule on 05/03/2019

## 2019-05-03 ENCOUNTER — Encounter: Payer: Self-pay | Admitting: Primary Care

## 2019-05-03 ENCOUNTER — Ambulatory Visit: Payer: BC Managed Care – PPO | Admitting: Primary Care

## 2019-05-03 ENCOUNTER — Other Ambulatory Visit: Payer: Self-pay

## 2019-05-03 VITALS — BP 158/78 | HR 83 | Temp 97.8°F | Ht 69.0 in | Wt 238.5 lb

## 2019-05-03 DIAGNOSIS — R0683 Snoring: Secondary | ICD-10-CM | POA: Diagnosis not present

## 2019-05-03 DIAGNOSIS — I10 Essential (primary) hypertension: Secondary | ICD-10-CM | POA: Diagnosis not present

## 2019-05-03 DIAGNOSIS — R5383 Other fatigue: Secondary | ICD-10-CM | POA: Diagnosis not present

## 2019-05-03 DIAGNOSIS — G4730 Sleep apnea, unspecified: Secondary | ICD-10-CM | POA: Insufficient documentation

## 2019-05-03 MED ORDER — HYDROCHLOROTHIAZIDE 25 MG PO TABS: 25.0000 mg | ORAL_TABLET | Freq: Every day | ORAL | 0 refills | Status: DC

## 2019-05-03 NOTE — Progress Notes (Signed)
Subjective:    Patient ID: Misty Brown, female    DOB: 08/17/1981, 38 y.o.   MRN: [id number]  HPI  Misty Brown is a 38 year old female with a history of hypertension, gestational diabetes, anxiety, depression who presents today with a chief complaint of fatigue.  Over the last month she's felt low energy/fatigue, "felt off". Last week she felt "vertigo symptoms" with some nausea.   She's been checking her BP infrequently, checked her BP on Father's Day which was 161/90. She cut out bread, sweets, sodas in late June 2020 in an attempt to lose weight and improve symptoms but hasn't noticed improvement. Since then she added in several soft drinks and a few cookies due to her nausea and dizziness last week, has noticed some improvement in energy.   She was told that she had a period of sleep apnea during her uterine ablation surgery in March 2020, her spouse has also mentioned that she snores. She has a family history of hypertension in mother.   She denies dizziness, nausea, anxiety, increased stress, chest pain, polyuria/polydipsia.  BP Readings from Last 3 Encounters:  05/03/19 (!) 158/78  03/02/18 124/76  01/07/18 124/76     Review of Systems  Constitutional: Positive for fatigue. Negative for fever.  Respiratory: Negative for cough.        Snoring   Gastrointestinal: Negative for abdominal pain and diarrhea.  Endocrine: Negative for cold intolerance, heat intolerance, polydipsia and polyuria.  Neurological: Negative for dizziness.  Psychiatric/Behavioral: The patient is not nervous/anxious.        Past Medical History:  Diagnosis Date  . Anxiety and depression   . Elevated blood pressure reading   . Gestational diabetes      Social History   Socioeconomic History  . Marital status: Married    Spouse name: Not on file  . Number of children: Not on file  . Years of education: Not on file  . Highest education level: Not on file  Occupational History  . Not on file   Social Needs  . Financial resource strain: Not on file  . Food insecurity    Worry: Not on file    Inability: Not on file  . Transportation needs    Medical: Not on file    Non-medical: Not on file  Tobacco Use  . Smoking status: Never Smoker  . Smokeless tobacco: Never Used  Substance and Sexual Activity  . Alcohol use: No  . Drug use: No  . Sexual activity: Yes  Lifestyle  . Physical activity    Days per week: Not on file    Minutes per session: Not on file  . Stress: Not on file  Relationships  . Social Musicianconnections    Talks on phone: Not on file    Gets together: Not on file    Attends religious service: Not on file    Active member of club or organization: Not on file    Attends meetings of clubs or organizations: Not on file    Relationship status: Not on file  . Intimate partner violence    Fear of current or ex partner: Not on file    Emotionally abused: Not on file    Physically abused: Not on file    Forced sexual activity: Not on file  Other Topics Concern  . Not on file  Social History Narrative   Married.   3 children.   Works in [employer].   Enjoys spending time with her  family.    Past Surgical History:  Procedure Laterality Date  . TUBAL LIGATION N/A 03/23/2017   Procedure: POST PARTUM TUBAL LIGATION;  Surgeon: Sanjuana Kava, MD;  Location: [facility];  Service: Gynecology;  Laterality: N/A;  . WISDOM TOOTH EXTRACTION      Family History  Problem Relation Age of Onset  . Hypertension Mother   . Hyperlipidemia Mother   . Heart disease Maternal Grandmother   . Heart disease Maternal Grandfather   . Stroke Paternal Grandmother     Allergies  Allergen Reactions  . Dicyclomine     Abdominal pain   . Doxycycline Nausea And Vomiting    No current outpatient medications on file prior to visit.   No current facility-administered medications on file prior to visit.     BP (!) 158/78   Pulse 83   Temp 97.8 F (36.6 C) (Temporal)   Ht 5\' 9"  (1.753 m)    Wt 238 lb 8 oz (108.2 kg)   SpO2 98%   BMI 35.22 kg/m    Objective:   Physical Exam  Constitutional: She appears well-nourished.  Neck: Neck supple.  Cardiovascular: Normal rate and regular rhythm.  Respiratory: Effort normal and breath sounds normal.  Skin: Skin is warm and dry.  Psychiatric: She has a normal mood and affect.           Assessment & Plan:

## 2019-05-03 NOTE — Assessment & Plan Note (Signed)
Acute for the last month, little improvement.  Differential diagnoses include hypertension, thyroid disorder, undiagnosed sleep apnea, anemia, vitamin deficiencies.   Check labs today. Referral placed to pulmonology for sleep study. Will treat BP.  Follow up in 2 weeks.

## 2019-05-03 NOTE — Assessment & Plan Note (Signed)
Also with period of apnea during surgery in March 2020. Referral placed to pulmonology for sleep evaluate.

## 2019-05-03 NOTE — Patient Instructions (Signed)
Stop by the lab prior to leaving today. I will notify you of your results once received.   You will be contacted regarding your referral to pulmonology for the sleep study.  Please let us know if you have not been contacted within one week.   Start hydrochlorothiazide 25 mg once daily for blood pressure.  Start exercising. You should be getting 150 minutes of moderate intensity exercise weekly.  It's important to improve your diet by reducing consumption of fast food, fried food, processed snack foods, sugary drinks. Increase consumption of fresh vegetables and fruits, whole grains, water.  Ensure you are drinking 64 ounces of water daily.  Schedule a follow up visit for 2 weeks for blood pressure check.  It was a pleasure to see you today!

## 2019-05-03 NOTE — Assessment & Plan Note (Signed)
Above goal in the office today, also with her home reading in June 2020. Suspect this could be contributing to recent symptoms of fatigue.  If she does have undiagnosed sleep apnea then this could be contributing to increased BP readings. Given family history of HTN (and personal history) we will start with HCTZ 25 mg once daily. We will also refer her to pulmonology for sleep study.  We will see her back in 2 weeks for BP check and BMP.

## 2019-05-04 DIAGNOSIS — E559 Vitamin D deficiency, unspecified: Secondary | ICD-10-CM

## 2019-05-04 LAB — COMPREHENSIVE METABOLIC PANEL
ALT: 15 U/L (ref 0–35)
AST: 14 U/L (ref 0–37)
Albumin: 4.4 g/dL (ref 3.5–5.2)
Alkaline Phosphatase: 58 U/L (ref 39–117)
BUN: 8 mg/dL (ref 6–23)
CO2: 26 mEq/L (ref 19–32)
Calcium: 9.4 mg/dL (ref 8.4–10.5)
Chloride: 104 mEq/L (ref 96–112)
Creatinine, Ser: 0.61 mg/dL (ref 0.40–1.20)
GFR: 110.07 mL/min (ref >60.00)
Glucose, Bld: 87 mg/dL (ref 70–99)
Potassium: 4.1 mEq/L (ref 3.5–5.1)
Sodium: 138 mEq/L (ref 135–145)
Total Bilirubin: 0.2 mg/dL (ref 0.2–1.2)
Total Protein: 7.2 g/dL (ref 6.0–8.3)

## 2019-05-04 LAB — VITAMIN B12: Vitamin B-12: 258 pg/mL (ref 211–911)

## 2019-05-04 LAB — CBC
HCT: 37 % (ref 36.0–46.0)
Hemoglobin: 12.2 g/dL (ref 12.0–15.0)
MCHC: 32.9 g/dL (ref 30.0–36.0)
MCV: 76.7 fl — ABNORMAL LOW (ref 78.0–100.0)
Platelets: 271 10*3/uL (ref 150.0–400.0)
RBC: 4.82 Mil/uL (ref 3.87–5.11)
RDW: 13.9 % (ref 11.5–15.5)
WBC: 8.5 10*3/uL (ref 4.0–10.5)

## 2019-05-04 LAB — TSH: TSH: 0.71 u[IU]/mL (ref 0.35–4.50)

## 2019-05-04 LAB — VITAMIN D 25 HYDROXY (VIT D DEFICIENCY, FRACTURES): VITD: 19.91 ng/mL — ABNORMAL LOW (ref 30.00–100.00)

## 2019-05-04 LAB — HEMOGLOBIN A1C: Hgb A1c MFr Bld: 5.6 % (ref 4.6–6.5)

## 2019-05-04 MED ORDER — VITAMIN D (ERGOCALCIFEROL) 1.25 MG (50000 UNIT) PO CAPS: ORAL_CAPSULE | ORAL | 0 refills | Status: DC

## 2019-05-05 NOTE — Telephone Encounter (Signed)
Sending to [place] for review upon her return. Patient was advised that provider is out of the office until 05/10/2019

## 2019-05-11 ENCOUNTER — Encounter: Payer: Self-pay | Admitting: Internal Medicine

## 2019-05-11 ENCOUNTER — Ambulatory Visit (INDEPENDENT_AMBULATORY_CARE_PROVIDER_SITE_OTHER): Payer: BC Managed Care – PPO | Admitting: Internal Medicine

## 2019-05-11 ENCOUNTER — Other Ambulatory Visit: Payer: Self-pay

## 2019-05-11 VITALS — BP 126/70 | HR 81 | Temp 98.1°F | Ht 69.0 in | Wt 234.0 lb

## 2019-05-11 DIAGNOSIS — G4719 Other hypersomnia: Secondary | ICD-10-CM | POA: Diagnosis not present

## 2019-05-11 NOTE — Progress Notes (Signed)
[facility] Consultation      Assessment and Plan:  Excessive daytime sleepiness. -Symptoms and signs of obstructive sleep apnea. - We will send for sleep study.  If severity of OSA is mild can consider observation or referral for dental device as her symptoms appear to be fairly mild.  Essential hypertension. - Sleep apnea can contribute to above condition, therefore treatment of sleep apnea is important part of management.  Orders Placed This Encounter  Procedures  . Home sleep test   Return in about 3 months (around 08/11/2019).    Date: 05/11/2019  MRN# [id number] Misty Brown 27-Feb-1981   Misty Brown is a 38 y.o. old female seen in consultation for chief complaint of:    Chief Complaint  Patient presents with  . sleep consult    per Alma Friendly- no prior sleep study. c/o loud snoring and stop breathing during sleep.     HPI:  Misty Brown is a 38 y.o. old female with complaints of loud snoring and stopping breathing.  She typically goes to bed for 930 and 10:30 PM, she wakes up at 5:30 AM. She has been having high blood pressure and screened positive for possible OSA. She had a procedure done last march and was told that she stops breathing and was recommended to see a sleep specialist.  She noted that she was feeling very tired but much better since starting BP medicine, vitamin D. She is now feeling better.   Denies jaw pain, TMJ, sleep paralysis, sleep walking, cataplexy. She denies trouble staying awake during the day.   PMHX:   Past Medical History:  Diagnosis Date  . Anxiety and depression   . Elevated blood pressure reading   . Gestational diabetes    Surgical Hx:  Past Surgical History:  Procedure Laterality Date  . TUBAL LIGATION N/A 03/23/2017   Procedure: POST PARTUM TUBAL LIGATION;  Surgeon: Sanjuana Kava, MD;  Location: [facility];  Service: Gynecology;  Laterality: N/A;  . WISDOM TOOTH EXTRACTION     Family Hx:  Family  History  Problem Relation Age of Onset  . Hypertension Mother   . Hyperlipidemia Mother   . Heart disease Maternal Grandmother   . Heart disease Maternal Grandfather   . Stroke Paternal Grandmother    Social Hx:   Social History   Tobacco Use  . Smoking status: Never Smoker  . Smokeless tobacco: Never Used  Substance Use Topics  . Alcohol use: No  . Drug use: No   Medication:    Current Outpatient Medications:  .  hydrochlorothiazide (HYDRODIURIL) 25 MG tablet, Take 1 tablet (25 mg total) by mouth daily. For blood pressure., Disp: 30 tablet, Rfl: 0 .  Vitamin D, Ergocalciferol, (DRISDOL) 1.25 MG (50000 UT) CAPS capsule, Take 1 capsule by mouth once weekly for 12 weeks., Disp: 12 capsule, Rfl: 0   Allergies:  Dicyclomine and Doxycycline  Review of Systems: Gen:  Denies  fever, sweats, chills HEENT: Denies blurred vision, double vision. bleeds, sore throat Cvc:  No dizziness, chest pain. Resp:   Denies cough or sputum production, shortness of breath Gi: Denies swallowing difficulty, stomach pain. Gu:  Denies bladder incontinence, burning urine Ext:   No Joint pain, stiffness. Skin: No skin rash,  hives  Endoc:  No polyuria, polydipsia. Psych: No depression, insomnia. Other:  All other systems were reviewed with the patient and were negative other that what is mentioned in the HPI.   Physical Examination:   VS: BP 126/70 (  BP Location: Left Arm, Cuff Size: Normal)   Pulse 81   Temp 98.1 F (36.7 C) (Temporal)   Ht 5\' 9"  (1.753 m)   Wt 234 lb (106.1 kg)   SpO2 97%   BMI 34.56 kg/m   General Appearance: No distress  Neuro:without focal findings,  speech normal,  HEENT: PERRLA, EOM intact.   Pulmonary: normal breath sounds, No wheezing.  CardiovascularNormal S1,S2.  No m/r/g.   Abdomen: Benign, Soft, non-tender. Renal:  No costovertebral tenderness  GU:  No performed at this time. Endoc: No evident thyromegaly, no signs of acromegaly. Skin:   warm, no rashes, no  ecchymosis  Extremities: normal, no cyanosis, clubbing.  Other findings:    LABORATORY PANEL:   CBC No results for input(s): WBC, HGB, HCT, PLT in the last 168 hours. ------------------------------------------------------------------------------------------------------------------  Chemistries  No results for input(s): NA, K, CL, CO2, GLUCOSE, BUN, CREATININE, CALCIUM, MG, AST, ALT, ALKPHOS, BILITOT in the last 168 hours.  Invalid input(s): GFRCGP ------------------------------------------------------------------------------------------------------------------  Cardiac Enzymes No results for input(s): TROPONINI in the last 168 hours. ------------------------------------------------------------  RADIOLOGY:  No results found.     Thank  you for the consultation and for allowing [facility] to assist in the care of your patient. Our recommendations are noted above.  Please contact us if we can be of further service.   Wells Guileseep Dwayne Bulkley, M.D., F.C.C.P.  Board Certified in Internal Medicine, Pulmonary Medicine, Critical Care Medicine, and Sleep Medicine.  [facility] Office Number: [phone]   05/11/2019

## 2019-05-11 NOTE — Patient Instructions (Signed)

## 2019-05-17 ENCOUNTER — Other Ambulatory Visit: Payer: Self-pay

## 2019-05-17 ENCOUNTER — Ambulatory Visit: Payer: BC Managed Care – PPO | Admitting: Primary Care

## 2019-05-17 ENCOUNTER — Encounter: Payer: Self-pay | Admitting: Primary Care

## 2019-05-17 VITALS — BP 126/82 | HR 78 | Temp 98.1°F | Ht 69.0 in | Wt 237.5 lb

## 2019-05-17 DIAGNOSIS — I10 Essential (primary) hypertension: Secondary | ICD-10-CM

## 2019-05-17 NOTE — Progress Notes (Signed)
Subjective:    Patient ID: Misty Brown, female    DOB: 13-Jan-1981, 38 y.o.   MRN: [id number]  HPI  Ms. Couse is a 37 year old female who presents today for follow up of hypertension.  She is currently managed on HCTZ 25 mg which was initiated two weeks ago given elevated readings. She was also sent off for sleep study due to symptoms of sleep apnea.  Since her last visit she's not checking her BP. She did check her BP this morning but doesn't remember her reading. She has noticed some dizziness with nausea at times, mostly in the evenings when sitting still/resting. This doesn't occur often.   She is waiting to hear about her home sleep study evaluation.   BP Readings from Last 3 Encounters:  05/17/19 126/82  05/11/19 126/70  05/03/19 (!) 158/78     Review of Systems  Respiratory: Negative for shortness of breath.   Cardiovascular: Negative for chest pain.  Neurological: Negative for headaches.       Occasional dizziness       Past Medical History:  Diagnosis Date  . Anxiety and depression   . Elevated blood pressure reading   . Gestational diabetes      Social History   Socioeconomic History  . Marital status: Married    Spouse name: Not on file  . Number of children: Not on file  . Years of education: Not on file  . Highest education level: Not on file  Occupational History  . Not on file  Social Needs  . Financial resource strain: Not on file  . Food insecurity    Worry: Not on file    Inability: Not on file  . Transportation needs    Medical: Not on file    Non-medical: Not on file  Tobacco Use  . Smoking status: Never Smoker  . Smokeless tobacco: Never Used  Substance and Sexual Activity  . Alcohol use: No  . Drug use: No  . Sexual activity: Yes  Lifestyle  . Physical activity    Days per week: Not on file    Minutes per session: Not on file  . Stress: Not on file  Relationships  . Social Herbalist on phone: Not on file    Gets  together: Not on file    Attends religious service: Not on file    Active member of club or organization: Not on file    Attends meetings of clubs or organizations: Not on file    Relationship status: Not on file  . Intimate partner violence    Fear of current or ex partner: Not on file    Emotionally abused: Not on file    Physically abused: Not on file    Forced sexual activity: Not on file  Other Topics Concern  . Not on file  Social History Narrative   Married.   3 children.   Works in [employer].   Enjoys spending time with her family.    Past Surgical History:  Procedure Laterality Date  . TUBAL LIGATION N/A 03/23/2017   Procedure: POST PARTUM TUBAL LIGATION;  Surgeon: Sanjuana Kava, MD;  Location: [facility];  Service: Gynecology;  Laterality: N/A;  . WISDOM TOOTH EXTRACTION      Family History  Problem Relation Age of Onset  . Hypertension Mother   . Hyperlipidemia Mother   . Heart disease Maternal Grandmother   . Heart disease Maternal Grandfather   . Stroke Paternal  Grandmother     Allergies  Allergen Reactions  . Dicyclomine     Abdominal pain   . Doxycycline Nausea And Vomiting    Current Outpatient Medications on File Prior to Visit  Medication Sig Dispense Refill  . hydrochlorothiazide (HYDRODIURIL) 25 MG tablet Take 1 tablet (25 mg total) by mouth daily. For blood pressure. 30 tablet 0  . Vitamin D, Ergocalciferol, (DRISDOL) 1.25 MG (50000 UT) CAPS capsule Take 1 capsule by mouth once weekly for 12 weeks. 12 capsule 0   No current facility-administered medications on file prior to visit.     BP 126/82   Pulse 78   Temp 98.1 F (36.7 C) (Temporal)   Ht 5\' 9"  (1.753 m)   Wt 237 lb 8 oz (107.7 kg)   SpO2 98%   BMI 35.07 kg/m    Objective:   Physical Exam  Constitutional: She appears well-nourished.  Neck: Neck supple.  Cardiovascular: Normal rate and regular rhythm.  Respiratory: Effort normal and breath sounds normal.  Skin: Skin is warm and dry.            Assessment & Plan:

## 2019-05-17 NOTE — Assessment & Plan Note (Signed)
Improved with addition of HCTZ 25 mg. Continue same. Repeat BMP pending. Will send refills once BMP returns.

## 2019-05-17 NOTE — Patient Instructions (Signed)
Continue hydrochlorothiazide 25 mg tablets for blood pressure.  Stop by the lab prior to leaving today. I will notify you of your results once received.   It was a pleasure to see you today!

## 2019-05-18 LAB — BASIC METABOLIC PANEL
BUN: 9 mg/dL (ref 6–23)
CO2: 30 mEq/L (ref 19–32)
Calcium: 9.5 mg/dL (ref 8.4–10.5)
Chloride: 102 mEq/L (ref 96–112)
Creatinine, Ser: 0.71 mg/dL (ref 0.40–1.20)
GFR: 92.36 mL/min (ref >60.00)
Glucose, Bld: 106 mg/dL — ABNORMAL HIGH (ref 70–99)
Potassium: 3.8 mEq/L (ref 3.5–5.1)
Sodium: 138 mEq/L (ref 135–145)

## 2019-05-19 NOTE — Telephone Encounter (Signed)
Misty Brown, any thoughts on this? Has this occurred with other patients in the past?

## 2019-05-23 ENCOUNTER — Telehealth: Payer: Self-pay | Admitting: Internal Medicine

## 2019-05-23 NOTE — Telephone Encounter (Signed)
Pt stated that she got a letter from insurance stating they are denying coverage for sleep study. She wants to know why. I let her know that she would have to contact them to find out why and she said on the letter it states that due to the "reason" which was "fatigue" so she wants to know if we can send them more information for approval.

## 2019-05-23 NOTE — Telephone Encounter (Signed)
Misty Brown can you assist with this. Thanks

## 2019-05-24 NOTE — Telephone Encounter (Signed)
Diagnosis on order is Excessive Daytime Sleepiness.  I went on [employer] web site and obtained Authorization for HST.  Authorization # [id number] Valid from 05/24/2019 to 11/20/2019.  Will contact patient to advise and schedule HST. Rhonda J Cobb

## 2019-05-24 NOTE — Telephone Encounter (Signed)
Rhonda please advise. Thanks 

## 2019-05-24 NOTE — Telephone Encounter (Signed)
She has not had the test yet, someone must be working on getting prior-auth and that is what is being denied.  Does Suanne Marker do them for [place] or do they get sent to [place]?

## 2019-05-25 ENCOUNTER — Other Ambulatory Visit: Payer: Self-pay | Admitting: Primary Care

## 2019-05-25 DIAGNOSIS — I10 Essential (primary) hypertension: Secondary | ICD-10-CM

## 2019-06-10 ENCOUNTER — Other Ambulatory Visit: Payer: Self-pay

## 2019-06-10 ENCOUNTER — Ambulatory Visit: Payer: BC Managed Care – PPO

## 2019-06-10 DIAGNOSIS — G4719 Other hypersomnia: Secondary | ICD-10-CM

## 2019-06-10 DIAGNOSIS — G4733 Obstructive sleep apnea (adult) (pediatric): Secondary | ICD-10-CM

## 2019-07-07 ENCOUNTER — Telehealth: Payer: Self-pay | Admitting: Primary Care

## 2019-07-07 ENCOUNTER — Telehealth: Payer: Self-pay | Admitting: Internal Medicine

## 2019-07-07 DIAGNOSIS — E559 Vitamin D deficiency, unspecified: Secondary | ICD-10-CM

## 2019-07-07 NOTE — Telephone Encounter (Signed)
Called and spoke to pt, who is requesting sleep study results.  I have made pt aware that [place] providers are currently gaining access to read studies.  Pt is aware and voiced her understanding.  Nothing further is needed.

## 2019-07-07 NOTE — Telephone Encounter (Signed)
Left message for pt

## 2019-07-07 NOTE — Telephone Encounter (Signed)
Spoken and notified patient of Kate Clark's comments. Patient verbalized understanding.  

## 2019-07-07 NOTE — Telephone Encounter (Signed)
Labs had not yet been placed, no she does not need to fast.

## 2019-07-07 NOTE — Telephone Encounter (Signed)
Patient is scheduled for labs on the 27th.  I did not see any orders in.  Are these labs fasting or non fasting ?

## 2019-07-19 ENCOUNTER — Other Ambulatory Visit: Payer: Self-pay | Admitting: Primary Care

## 2019-07-19 DIAGNOSIS — E559 Vitamin D deficiency, unspecified: Secondary | ICD-10-CM

## 2019-07-19 NOTE — Telephone Encounter (Signed)
Last prescribed on 05/04/2019 . Last appointment on 05/17/2019. Next future appointment is a lab appointment on 07/26/2019

## 2019-07-20 ENCOUNTER — Telehealth: Payer: Self-pay | Admitting: Pulmonary Disease

## 2019-07-20 DIAGNOSIS — G4733 Obstructive sleep apnea (adult) (pediatric): Secondary | ICD-10-CM

## 2019-07-20 NOTE — Telephone Encounter (Signed)
Mild OSA AHI 7/hour Weight loss 10 to 20 pounds alone would help -since her symptoms are mild would suggest this approach and observation If she worsens in the future, can consider dental appliance or CPAP

## 2019-07-20 NOTE — Telephone Encounter (Signed)
I called the patient and advised her of the results.  Patient stated she was seen by Dr. Ashby Dawes, but he is no longer with the practice.  Dr. Elsworth Soho, based on that and your suggestion for observation, when would you like the patient to come  see you for follow up?

## 2019-07-21 NOTE — Telephone Encounter (Signed)
56-month follow-up with APP at [facility] or with [place]

## 2019-07-21 NOTE — Telephone Encounter (Signed)
LVM for patient to request she contact our office to set up appointment based on the response received from Dr. Elsworth Soho.

## 2019-07-26 ENCOUNTER — Other Ambulatory Visit: Payer: BC Managed Care – PPO

## 2019-07-26 ENCOUNTER — Other Ambulatory Visit (INDEPENDENT_AMBULATORY_CARE_PROVIDER_SITE_OTHER): Payer: BC Managed Care – PPO

## 2019-07-26 DIAGNOSIS — E559 Vitamin D deficiency, unspecified: Secondary | ICD-10-CM | POA: Diagnosis not present

## 2019-07-29 LAB — VITAMIN D 25 HYDROXY (VIT D DEFICIENCY, FRACTURES): VITD: 35.5 ng/mL (ref 30.00–100.00)

## 2019-11-22 ENCOUNTER — Other Ambulatory Visit: Payer: Self-pay | Admitting: Primary Care

## 2019-11-22 DIAGNOSIS — I10 Essential (primary) hypertension: Secondary | ICD-10-CM

## 2020-02-19 ENCOUNTER — Other Ambulatory Visit: Payer: Self-pay | Admitting: Primary Care

## 2020-02-19 DIAGNOSIS — I10 Essential (primary) hypertension: Secondary | ICD-10-CM

## 2020-04-26 DIAGNOSIS — Z124 Encounter for screening for malignant neoplasm of cervix: Secondary | ICD-10-CM | POA: Diagnosis not present

## 2020-04-26 DIAGNOSIS — Z01419 Encounter for gynecological examination (general) (routine) without abnormal findings: Secondary | ICD-10-CM | POA: Diagnosis not present

## 2020-04-26 DIAGNOSIS — Z6835 Body mass index (BMI) 35.0-35.9, adult: Secondary | ICD-10-CM | POA: Diagnosis not present

## 2020-04-26 LAB — HM PAP SMEAR: HM Pap smear: NEGATIVE

## 2020-05-21 ENCOUNTER — Other Ambulatory Visit: Payer: Self-pay | Admitting: Primary Care

## 2020-05-21 DIAGNOSIS — I10 Essential (primary) hypertension: Secondary | ICD-10-CM

## 2020-06-22 ENCOUNTER — Other Ambulatory Visit: Payer: Self-pay

## 2020-06-22 ENCOUNTER — Encounter: Payer: Self-pay | Admitting: Primary Care

## 2020-06-22 ENCOUNTER — Ambulatory Visit: Payer: BC Managed Care – PPO | Admitting: Primary Care

## 2020-06-22 VITALS — BP 124/82 | HR 88 | Temp 96.2°F | Ht 69.0 in | Wt 240.0 lb

## 2020-06-22 DIAGNOSIS — Z1152 Encounter for screening for COVID-19: Secondary | ICD-10-CM | POA: Diagnosis not present

## 2020-06-22 DIAGNOSIS — H93239 Hyperacusis, unspecified ear: Secondary | ICD-10-CM | POA: Insufficient documentation

## 2020-06-22 DIAGNOSIS — G4730 Sleep apnea, unspecified: Secondary | ICD-10-CM

## 2020-06-22 DIAGNOSIS — H93233 Hyperacusis, bilateral: Secondary | ICD-10-CM

## 2020-06-22 DIAGNOSIS — I10 Essential (primary) hypertension: Secondary | ICD-10-CM

## 2020-06-22 LAB — COMPREHENSIVE METABOLIC PANEL
ALT: 22 U/L (ref 0–35)
AST: 17 U/L (ref 0–37)
Albumin: 4.2 g/dL (ref 3.5–5.2)
Alkaline Phosphatase: 50 U/L (ref 39–117)
BUN: 8 mg/dL (ref 6–23)
CO2: 27 mEq/L (ref 19–32)
Calcium: 9.1 mg/dL (ref 8.4–10.5)
Chloride: 104 mEq/L (ref 96–112)
Creatinine, Ser: 0.65 mg/dL (ref 0.40–1.20)
GFR: 101.67 mL/min (ref >60.00)
Glucose, Bld: 88 mg/dL (ref 70–99)
Potassium: 3.9 mEq/L (ref 3.5–5.1)
Sodium: 138 mEq/L (ref 135–145)
Total Bilirubin: 0.4 mg/dL (ref 0.2–1.2)
Total Protein: 7.4 g/dL (ref 6.0–8.3)

## 2020-06-22 LAB — LIPID PANEL
Cholesterol: 130 mg/dL (ref 0–200)
HDL: 41.1 mg/dL (ref >39.00)
LDL Cholesterol: 73 mg/dL (ref 0–99)
NonHDL: 88.85
Total CHOL/HDL Ratio: 3
Triglycerides: 77 mg/dL (ref 0.0–149.0)
VLDL: 15.4 mg/dL (ref 0.0–40.0)

## 2020-06-22 LAB — SARS-COV-2 IGG: SARS-COV-2 IgG: 0.04

## 2020-06-22 MED ORDER — HYDROCHLOROTHIAZIDE 25 MG PO TABS: 25.0000 mg | ORAL_TABLET | Freq: Every day | ORAL | 3 refills | Status: DC

## 2020-06-22 NOTE — Assessment & Plan Note (Signed)
Well controlled on HCTZ 25 mg.  CMP pending. Refills provided.

## 2020-06-22 NOTE — Progress Notes (Signed)
Subjective:    Patient ID: Misty Brown, female    DOB: 1981-01-25, 39 y.o.   MRN: [id number]  HPI  This visit occurred during the SARS-CoV-2 public health emergency.  Safety protocols were in place, including screening questions prior to the visit, additional usage of staff PPE, and extensive cleaning of exam room while observing appropriate contact time as indicated for disinfecting solutions.   Misty Brown is a 39 year old female with a history of hypertension, gestational diabetes, anxiety and depression who presents today for medication refill and follow up.   She would also like Covid-19 antibody testing. She was very sick in March 2021, didn't test for Covid-19 at the time. Is questioning whether or not she had Covid-19.  Currently managed on HCTZ 25 mg daily. She endorses daily compliance. She denies headaches, chest pain, dizziness.   She has noticed a "white noise" sensation to her ears over the last several months. Overall the noise doesn't bother her, but she will notice that her "ears hurt" when her kids are loud. She first noticed this after mowing her lawn earlier this Summer, wasn't wearing ear protection. She will notice this "white noise" with quiet and loud conversation. She denies a history of listening to loud music or being in loud environments. She does not take Asprin. She denies decreased hearing.   BP Readings from Last 3 Encounters:  06/22/20 124/82  05/17/19 126/82  05/11/19 126/70     Review of Systems  HENT:       "white noise" sensation to ears  Eyes: Negative for visual disturbance.  Respiratory: Negative for shortness of breath.   Cardiovascular: Negative for chest pain.  Neurological: Negative for dizziness and headaches.       Past Medical History:  Diagnosis Date  . Anxiety and depression   . Elevated blood pressure reading   . Gestational diabetes      Social History   Socioeconomic History  . Marital status: Married    Spouse name:  Not on file  . Number of children: Not on file  . Years of education: Not on file  . Highest education level: Not on file  Occupational History  . Not on file  Tobacco Use  . Smoking status: Never Smoker  . Smokeless tobacco: Never Used  Substance and Sexual Activity  . Alcohol use: No  . Drug use: No  . Sexual activity: Yes  Other Topics Concern  . Not on file  Social History Narrative   Married.   3 children.   Works in [employer].   Enjoys spending time with her family.   Social Determinants of Health   Financial Resource Strain:   . Difficulty of Paying Living Expenses: Not on file  Food Insecurity:   . Worried About Programme researcher, broadcasting/film/video in the Last Year: Not on file  . Ran Out of Food in the Last Year: Not on file  Transportation Needs:   . Lack of Transportation (Medical): Not on file  . Lack of Transportation (Non-Medical): Not on file  Physical Activity:   . Days of Exercise per Week: Not on file  . Minutes of Exercise per Session: Not on file  Stress:   . Feeling of Stress : Not on file  Social Connections:   . Frequency of Communication with Friends and Family: Not on file  . Frequency of Social Gatherings with Friends and Family: Not on file  . Attends Religious Services: Not on file  . Active  Member of Clubs or Organizations: Not on file  . Attends Banker Meetings: Not on file  . Marital Status: Not on file  Intimate Partner Violence:   . Fear of Current or Ex-Partner: Not on file  . Emotionally Abused: Not on file  . Physically Abused: Not on file  . Sexually Abused: Not on file    Past Surgical History:  Procedure Laterality Date  . TUBAL LIGATION N/A 03/23/2017   Procedure: POST PARTUM TUBAL LIGATION;  Surgeon: Essie Hart, MD;  Location: [facility];  Service: Gynecology;  Laterality: N/A;  . WISDOM TOOTH EXTRACTION      Family History  Problem Relation Age of Onset  . Hypertension Mother   . Hyperlipidemia Mother   . Heart disease  Maternal Grandmother   . Heart disease Maternal Grandfather   . Stroke Paternal Grandmother     Allergies  Allergen Reactions  . Dicyclomine     Abdominal pain   . Doxycycline Nausea And Vomiting    Current Outpatient Medications on File Prior to Visit  Medication Sig Dispense Refill  . hydrochlorothiazide (HYDRODIURIL) 25 MG tablet Take 1 tablet (25 mg total) by mouth daily. NEED AN APPOINTMENT FOR ANY REFILLS 90 tablet 0  . vitamin B-12 (CYANOCOBALAMIN) 500 MCG tablet Take 500 mcg by mouth daily.    . Vitamin D, Cholecalciferol, 25 MCG (1000 UT) TABS Take by mouth.     No current facility-administered medications on file prior to visit.    BP 124/82   Pulse 88   Temp (!) 96.2 F (35.7 C) (Temporal)   Ht 5\' 9"  (1.753 m)   Wt 240 lb (108.9 kg)   SpO2 99%   BMI 35.44 kg/m    Objective:   Physical Exam HENT:     Head:     Comments: Mild fluid buildup to bilateral TM's.  Cardiovascular:     Rate and Rhythm: Normal rate and regular rhythm.  Pulmonary:     Effort: Pulmonary effort is normal.     Breath sounds: Normal breath sounds.  Musculoskeletal:     Cervical back: Neck supple.  Skin:    General: Skin is warm and dry.            Assessment & Plan:

## 2020-06-22 NOTE — Patient Instructions (Signed)
Stop by the lab prior to leaving today. I will notify you of your results once received.   Try using Flonase (fluticasone) nasal spray. Instill 1 spray in each nostril twice daily.   Continue taking hydrochlorothiazide 25 mg for blood pressure.   It was a pleasure to see you today!

## 2020-06-22 NOTE — Assessment & Plan Note (Signed)
Acute for last several months, overall not bothersome. Exam today with mild fluid behind TM's, otherwise unremarkable.   Recommended Flonase BID. She will update.

## 2020-06-22 NOTE — Assessment & Plan Note (Signed)
Followed with pulmonology who diagnosed with mild sleep apnea and told her to lose weight.

## 2020-07-05 ENCOUNTER — Encounter: Payer: Self-pay | Admitting: Primary Care

## 2021-03-26 DIAGNOSIS — Z20828 Contact with and (suspected) exposure to other viral communicable diseases: Secondary | ICD-10-CM

## 2021-03-27 MED ORDER — OSELTAMIVIR PHOSPHATE 75 MG PO CAPS: 75.0000 mg | ORAL_CAPSULE | Freq: Every day | ORAL | 0 refills | Status: DC

## 2021-04-12 DIAGNOSIS — G8929 Other chronic pain: Secondary | ICD-10-CM | POA: Diagnosis not present

## 2021-04-12 DIAGNOSIS — M542 Cervicalgia: Secondary | ICD-10-CM | POA: Diagnosis not present

## 2021-04-12 DIAGNOSIS — M545 Low back pain, unspecified: Secondary | ICD-10-CM | POA: Diagnosis not present

## 2021-06-20 ENCOUNTER — Other Ambulatory Visit: Payer: Self-pay | Admitting: Primary Care

## 2021-06-20 DIAGNOSIS — I10 Essential (primary) hypertension: Secondary | ICD-10-CM

## 2021-07-14 ENCOUNTER — Other Ambulatory Visit: Payer: Self-pay | Admitting: Primary Care

## 2021-07-14 DIAGNOSIS — I10 Essential (primary) hypertension: Secondary | ICD-10-CM

## 2021-09-03 DIAGNOSIS — Z20828 Contact with and (suspected) exposure to other viral communicable diseases: Secondary | ICD-10-CM

## 2021-09-03 MED ORDER — OSELTAMIVIR PHOSPHATE 75 MG PO CAPS: 75.0000 mg | ORAL_CAPSULE | Freq: Every day | ORAL | 0 refills | Status: DC

## 2021-10-02 DIAGNOSIS — L293 Anogenital pruritus, unspecified: Secondary | ICD-10-CM | POA: Diagnosis not present

## 2021-10-02 DIAGNOSIS — Z01419 Encounter for gynecological examination (general) (routine) without abnormal findings: Secondary | ICD-10-CM | POA: Diagnosis not present

## 2021-10-02 DIAGNOSIS — N898 Other specified noninflammatory disorders of vagina: Secondary | ICD-10-CM | POA: Insufficient documentation

## 2022-02-27 ENCOUNTER — Encounter: Payer: Self-pay | Admitting: Primary Care

## 2022-02-27 ENCOUNTER — Ambulatory Visit: Payer: BC Managed Care – PPO | Admitting: Primary Care

## 2022-02-27 VITALS — BP 126/78 | HR 82 | Temp 98.6°F | Ht 69.0 in | Wt 243.0 lb

## 2022-02-27 DIAGNOSIS — I10 Essential (primary) hypertension: Secondary | ICD-10-CM | POA: Diagnosis not present

## 2022-02-27 DIAGNOSIS — R079 Chest pain, unspecified: Secondary | ICD-10-CM | POA: Diagnosis not present

## 2022-02-27 HISTORY — DX: Chest pain, unspecified: R07.9

## 2022-02-27 NOTE — Progress Notes (Signed)
Subjective:    Patient ID: Misty Brown, female    DOB: [birth year], 41 y.o.   MRN: [id number]  HPI  Misty Brown is a very pleasant 41 y.o. female with a history of sleep apnea, hypertension, fatigue, anxiety and depression, lower extremity weakness who presents today to discuss several symptoms/issues.  She has not been seen by me since September 2021.  1) Murmur: Evaluated at OB/GYN office in January 2023.  During this visit her OB/GYN mentioned that she noticed a murmur.  Patient has no prior history of known murmur so it was recommended she see a cardiologist.  Today she endorses never being connected with cardiology. Her mother has a heart murmur. She denies shortness of breath with normal ADL's.   2) Chest Pain: Intermittent for the last 6 months and located to the mid anterior chest for which she describes as stabbing pressure. She experiences these symptoms several times monthly. She's only noticed her pain at rest, both at home and work. She cannot correlate pain with meals. She has not noticed her pain when in bed at night.   She has taken Tums a few times with temporary improvement, symptoms return 3-4 days later. She denies throat fullness, belching.   She sometimes feels a fluttering to the mid chest.   3) Hypertension: Previously managed on HCTZ 12.5 mg daily. She has been taking HCTZ 12.5 mg daily consistently, has about 10 pills left. She has noticed a "groggy" feeling over the last few weeks so she held her HCTZ for 1 week and the groggy sensation resolved.   She's also noticed bilateral pedal and ankle edema intermittently for the last 2-3 weeks. When she wakes in the morning her swelling is resolved. She is active at work.   BP Readings from Last 3 Encounters:  02/27/22 126/78  06/22/20 124/82  05/17/19 126/82      Review of Systems  Respiratory:  Negative for chest tightness and shortness of breath.   Cardiovascular:  Positive for chest pain.   Gastrointestinal:  Negative for abdominal pain.       She denies belching, esophageal burning  Psychiatric/Behavioral:  The patient is not nervous/anxious.         Past Medical History:  Diagnosis Date   Anxiety and depression    Elevated blood pressure reading    Gestational diabetes     Social History   Socioeconomic History   Marital status: Married    Spouse name: Not on file   Number of children: Not on file   Years of education: Not on file   Highest education level: Not on file  Occupational History   Not on file  Tobacco Use   Smoking status: Never   Smokeless tobacco: Never  Substance and Sexual Activity   Alcohol use: No   Drug use: No   Sexual activity: Yes  Other Topics Concern   Not on file  Social History Narrative   Married.   3 children.   Works in [employer].   Enjoys spending time with her family.   Social Determinants of Health   Financial Resource Strain: Not on file  Food Insecurity: Not on file  Transportation Needs: Not on file  Physical Activity: Not on file  Stress: Not on file  Social Connections: Not on file  Intimate Partner Violence: Not on file    Past Surgical History:  Procedure Laterality Date   TUBAL LIGATION N/A 03/23/2017   Procedure: POST PARTUM TUBAL LIGATION;  Surgeon: Mora Appl,  Naoma Diener, MD;  Location: [facility];  Service: Gynecology;  Laterality: N/A;   WISDOM TOOTH EXTRACTION      Family History  Problem Relation Age of Onset   Hypertension Mother    Hyperlipidemia Mother    Heart disease Maternal Grandmother    Heart disease Maternal Grandfather    Stroke Paternal Grandmother     Allergies  Allergen Reactions   Dicyclomine     Abdominal pain    Doxycycline Nausea And Vomiting    Current Outpatient Medications on File Prior to Visit  Medication Sig Dispense Refill   hydrochlorothiazide (HYDRODIURIL) 25 MG tablet Take 1 tablet (25 mg total) by mouth daily. For blood pressure. Office visit required for further  refills. 30 tablet 0   vitamin B-12 (CYANOCOBALAMIN) 500 MCG tablet Take 500 mcg by mouth daily.     Vitamin D, Cholecalciferol, 25 MCG (1000 UT) TABS Take by mouth.     No current facility-administered medications on file prior to visit.    BP 126/78   Pulse 82   Temp 98.6 F (37 C) (Oral)   Ht 5\' 9"  (1.753 m)   Wt 243 lb (110.2 kg)   SpO2 98%   BMI 35.88 kg/m  Objective:   Physical Exam Cardiovascular:     Rate and Rhythm: Normal rate and regular rhythm.  Pulmonary:     Effort: Pulmonary effort is normal.     Breath sounds: Normal breath sounds.  Musculoskeletal:     Cervical back: Neck supple.  Skin:    General: Skin is warm and dry.  Neurological:     Mental Status: She is alert.  Psychiatric:        Mood and Affect: Mood normal.          Assessment & Plan:

## 2022-02-27 NOTE — Assessment & Plan Note (Addendum)
Controlled.  Unclear how she's continued to refill her HCTZ.  Continue HCTZ 12.5 mg daily for now.  Consider switching to losartan 25 mg.   No murmur was heard upon auscultation.   Labs pending.

## 2022-02-27 NOTE — Assessment & Plan Note (Addendum)
Symptoms more suggestive of GERD, but will rule out cardiac cause.  ECG today with NSR, rate of 78. No PAC/PVC, no acute ST changes. No old ECG to compare.   Checking labs today including TSH, A1C, CMP, CBC.  Treat with omeprazole 20 mg daily for the next 2-3 weeks.  Consider cardiology evaluation.

## 2022-02-27 NOTE — Patient Instructions (Addendum)
Stop by the lab prior to leaving today. I will notify you of your results once received.   Start omeprazole 20 mg. Take 1 capsule by mouth one daily for the next 2-3 weeks.   It was a pleasure to see you today!

## 2022-02-28 DIAGNOSIS — I10 Essential (primary) hypertension: Secondary | ICD-10-CM

## 2022-02-28 LAB — TSH: TSH: 1.05 u[IU]/mL (ref 0.35–5.50)

## 2022-02-28 LAB — CBC
HCT: 38.5 % (ref 36.0–46.0)
Hemoglobin: 12.8 g/dL (ref 12.0–15.0)
MCHC: 33.3 g/dL (ref 30.0–36.0)
MCV: 77.8 fl — ABNORMAL LOW (ref 78.0–100.0)
Platelets: 234 10*3/uL (ref 150.0–400.0)
RBC: 4.95 Mil/uL (ref 3.87–5.11)
RDW: 13.1 % (ref 11.5–15.5)
WBC: 6.7 10*3/uL (ref 4.0–10.5)

## 2022-02-28 LAB — COMPREHENSIVE METABOLIC PANEL
ALT: 25 U/L (ref 0–35)
AST: 24 U/L (ref 0–37)
Albumin: 4.2 g/dL (ref 3.5–5.2)
Alkaline Phosphatase: 58 U/L (ref 39–117)
BUN: 11 mg/dL (ref 6–23)
CO2: 29 mEq/L (ref 19–32)
Calcium: 9.4 mg/dL (ref 8.4–10.5)
Chloride: 99 mEq/L (ref 96–112)
Creatinine, Ser: 0.77 mg/dL (ref 0.40–1.20)
GFR: 96.57 mL/min (ref >60.00)
Glucose, Bld: 94 mg/dL (ref 70–99)
Potassium: 3.3 mEq/L — ABNORMAL LOW (ref 3.5–5.1)
Sodium: 137 mEq/L (ref 135–145)
Total Bilirubin: 0.3 mg/dL (ref 0.2–1.2)
Total Protein: 7.2 g/dL (ref 6.0–8.3)

## 2022-02-28 LAB — HEMOGLOBIN A1C: Hgb A1c MFr Bld: 5.9 % (ref 4.6–6.5)

## 2022-02-28 LAB — LIPID PANEL
Cholesterol: 115 mg/dL (ref 0–200)
HDL: 38.4 mg/dL — ABNORMAL LOW (ref >39.00)
LDL Cholesterol: 53 mg/dL (ref 0–99)
NonHDL: 76.98
Total CHOL/HDL Ratio: 3
Triglycerides: 121 mg/dL (ref 0.0–149.0)
VLDL: 24.2 mg/dL (ref 0.0–40.0)

## 2022-02-28 MED ORDER — LOSARTAN POTASSIUM 50 MG PO TABS
50.0000 mg | ORAL_TABLET | Freq: Every day | ORAL | 0 refills | Status: DC
Start: 2022-02-28 — End: 2022-03-22

## 2022-03-22 ENCOUNTER — Telehealth: Payer: Self-pay | Admitting: Primary Care

## 2022-03-22 DIAGNOSIS — I10 Essential (primary) hypertension: Secondary | ICD-10-CM

## 2022-03-22 NOTE — Telephone Encounter (Signed)
Patient needs an office visit for BP check since we switched her from HCTZ to losartan several weeks ago. She never scheduled.

## 2022-03-24 NOTE — Telephone Encounter (Signed)
LVM to schedule an appointment.

## 2022-03-26 NOTE — Telephone Encounter (Signed)
Pt scheduled at the next spot she could do with her work schedule, 04/24/22 at 2:40

## 2022-04-22 ENCOUNTER — Encounter: Payer: Self-pay | Admitting: Primary Care

## 2022-04-22 ENCOUNTER — Ambulatory Visit: Payer: BC Managed Care – PPO | Admitting: Primary Care

## 2022-04-22 VITALS — BP 124/76 | HR 78 | Temp 98.0°F | Ht 69.0 in | Wt 252.0 lb

## 2022-04-22 DIAGNOSIS — R079 Chest pain, unspecified: Secondary | ICD-10-CM

## 2022-04-22 DIAGNOSIS — I10 Essential (primary) hypertension: Secondary | ICD-10-CM | POA: Diagnosis not present

## 2022-04-22 DIAGNOSIS — K219 Gastro-esophageal reflux disease without esophagitis: Secondary | ICD-10-CM

## 2022-04-22 MED ORDER — OMEPRAZOLE 20 MG PO CPDR: 20.0000 mg | DELAYED_RELEASE_CAPSULE | Freq: Every day | ORAL | 0 refills | Status: DC

## 2022-04-22 NOTE — Progress Notes (Signed)
Subjective:    Patient ID: Misty Brown, female    DOB: 1980-11-04, 41 y.o.   MRN: [id number]  Hypertension Pertinent negatives include no chest pain, headaches or shortness of breath.    Misty Brown is a very pleasant 41 y.o. female with a history of hypertension, sleep apnea, fatigue who presents today for follow-up of hypertension and chest pain.  She was last evaluated on 02/27/2022 for follow up, had not been seen since September 2021.  During this visit she endorsed daily compliance of her HCTZ 12.5 mg, but experienced a "groggy" feeling while taking.  Her groggy sensation abated when she discontinued her hydrochlorothiazide, however she began to notice bilateral foot and ankle swelling. During her prior visit we decided to continue her HCTZ 12.5 mg for now, mostly for her lower extremity swelling.  During her prior visit she also mentioned chronic and intermittent chest pain. During this visit her symptoms were suggestive of GERD so we was treated with omeprazole 20 mg daily. ECG was negative.   Since her last visit she is compliant to her HCTZ 12.5 mg daily. She denies the "groggy" feeling. Her chest pain resolved with omeprazole 20 mg so she discontinued omeprazole. Soon after discontinuation she noticed a return in her symptoms. She has since resumed her omeprazole 20 mg as of one week and symptoms have improved.     BP Readings from Last 3 Encounters:  04/22/22 124/76  02/27/22 126/78  06/22/20 124/82        Review of Systems  Respiratory:  Negative for shortness of breath.   Cardiovascular:  Negative for chest pain.  Neurological:  Negative for dizziness and headaches.         Past Medical History:  Diagnosis Date   Anxiety and depression    Elevated blood pressure reading    Gestational diabetes     Social History   Socioeconomic History   Marital status: Married    Spouse name: Not on file   Number of children: Not on file   Years of education: Not on  file   Highest education level: Not on file  Occupational History   Not on file  Tobacco Use   Smoking status: Never   Smokeless tobacco: Never  Substance and Sexual Activity   Alcohol use: No   Drug use: No   Sexual activity: Yes  Other Topics Concern   Not on file  Social History Narrative   Married.   3 children.   Works in [employer].   Enjoys spending time with her family.   Social Determinants of Health   Financial Resource Strain: Not on file  Food Insecurity: Not on file  Transportation Needs: Not on file  Physical Activity: Not on file  Stress: Not on file  Social Connections: Not on file  Intimate Partner Violence: Not on file    Past Surgical History:  Procedure Laterality Date   TUBAL LIGATION N/A 03/23/2017   Procedure: POST PARTUM TUBAL LIGATION;  Surgeon: Essie Hart, MD;  Location: [facility];  Service: Gynecology;  Laterality: N/A;   WISDOM TOOTH EXTRACTION      Family History  Problem Relation Age of Onset   Hypertension Mother    Hyperlipidemia Mother    Heart disease Maternal Grandmother    Heart disease Maternal Grandfather    Stroke Paternal Grandmother     Allergies  Allergen Reactions   Dicyclomine     Abdominal pain    Doxycycline Nausea And Vomiting  Current Outpatient Medications on File Prior to Visit  Medication Sig Dispense Refill   losartan (COZAAR) 50 MG tablet TAKE 1 TABLET (50 MG TOTAL) BY MOUTH DAILY. FOR BLOOD PRESSURE. 30 tablet 0   vitamin B-12 (CYANOCOBALAMIN) 500 MCG tablet Take 500 mcg by mouth daily.     Vitamin D, Cholecalciferol, 25 MCG (1000 UT) TABS Take by mouth.     No current facility-administered medications on file prior to visit.    BP 124/76   Pulse 78   Temp 98 F (36.7 C) (Oral)   Ht 5\' 9"  (1.753 m)   Wt 252 lb (114.3 kg)   SpO2 97%   BMI 37.21 kg/m  Objective:   Physical Exam Cardiovascular:     Rate and Rhythm: Normal rate and regular rhythm.  Pulmonary:     Effort: Pulmonary effort is normal.      Breath sounds: Normal breath sounds.  Musculoskeletal:     Cervical back: Neck supple.  Skin:    General: Skin is warm and dry.           Assessment & Plan:   Problem List Items Addressed This Visit       Cardiovascular and Mediastinum   Essential hypertension    Controlled.  Continue HCTZ 12.5 mg daily.        Other   Chest pain    Resolved.  Continue omeprazole 20 mg daily for now. Goal is to wean off at some point, may need to taper off with famotidine 20 mg. She will update when ready to come off.       Other Visit Diagnoses     Gastroesophageal reflux disease without esophagitis    -  Primary   Relevant Medications   omeprazole (PRILOSEC) 20 MG capsule          , NP

## 2022-04-22 NOTE — Assessment & Plan Note (Signed)
Controlled. Continue HCTZ 12.5 mg daily.

## 2022-04-22 NOTE — Assessment & Plan Note (Signed)
Resolved.  Continue omeprazole 20 mg daily for now. Goal is to wean off at some point, may need to taper off with famotidine 20 mg. She will update when ready to come off.

## 2022-04-22 NOTE — Patient Instructions (Signed)
Continue omeprazole 20 mg daily for heartburn.   Consider calling [facility] in [place] for weight loss.   Also consider [facility] or [employer].  It was a pleasure to see you today!

## 2022-04-23 DIAGNOSIS — K219 Gastro-esophageal reflux disease without esophagitis: Secondary | ICD-10-CM

## 2022-04-23 NOTE — Telephone Encounter (Signed)
I spoke with pt and she said the pharmacy advised her that ins would not cover the omeprazole 20 mg. Pt said she had been buying OTC but was only # 14 pills to bottle so pt was seen by Allayne Gitelman NP on 04/22/22 and Jae Dire sent in rx for omeprazole 20 mg.# 90. Pt will contact insurance co to see what possible approved meds could be substituted and pt will let Jae Dire know possible approved meds after talks with ins. Co. Sending note to Allayne Gitelman NP.

## 2022-04-24 ENCOUNTER — Ambulatory Visit: Payer: BC Managed Care – PPO | Admitting: Primary Care

## 2022-04-29 MED ORDER — PANTOPRAZOLE SODIUM 20 MG PO TBEC: 20.0000 mg | DELAYED_RELEASE_TABLET | Freq: Every day | ORAL | 0 refills | Status: DC

## 2022-04-30 ENCOUNTER — Telehealth: Payer: Self-pay

## 2022-04-30 NOTE — Telephone Encounter (Signed)
Prior auth started for Pantoprazole Sodium 20MG  dr tablets. [facility] - Rx #: Blanchard Kelch Waiting for determination.

## 2022-05-01 NOTE — Telephone Encounter (Signed)
Joellen, did you receive anything from [facility] regarding her pantoprazole?

## 2022-05-05 ENCOUNTER — Other Ambulatory Visit: Payer: Self-pay | Admitting: Primary Care

## 2022-05-05 DIAGNOSIS — I10 Essential (primary) hypertension: Secondary | ICD-10-CM

## 2022-05-05 MED ORDER — LANSOPRAZOLE 15 MG PO CPDR: 15.0000 mg | DELAYED_RELEASE_CAPSULE | Freq: Every day | ORAL | 0 refills | Status: DC

## 2022-05-05 NOTE — Addendum Note (Signed)
Addended by: Doreene Nest on: 05/05/2022 05:15 PM   Modules accepted: Orders

## 2022-05-05 NOTE — Telephone Encounter (Signed)
Yes. I received a phone note that it was denied. I have sent to you for review.

## 2022-05-05 NOTE — Telephone Encounter (Signed)
Noted.     See MyChart message.

## 2022-05-05 NOTE — Telephone Encounter (Signed)
Prior auth for Pantoprazole Sodium 20MG  dr tablets has been denied. Misty Brown [facility] - Rx #: Blanchard Kelch The request for coverage of Pantoprazole is denied. This medication is covered when two over-the-counter (OTC) generic proton pump inhibitors (such as omeprazole, lansoprazole) have been tried and did not work, or when the member cannot take all other OTC generic proton pump inhibitor medications. In this case, only one of the alternative medications has been tried, Omeprazole.  Alternative medications include:  Lansoprazole, Esomeprazole, etc.  Denial letter sent to scanning.

## 2022-08-04 ENCOUNTER — Ambulatory Visit: Payer: BC Managed Care – PPO | Admitting: Primary Care

## 2022-08-12 ENCOUNTER — Encounter: Payer: Self-pay | Admitting: Primary Care

## 2022-08-12 ENCOUNTER — Ambulatory Visit: Payer: BC Managed Care – PPO | Admitting: Primary Care

## 2022-08-12 VITALS — BP 126/80 | HR 90 | Temp 97.5°F | Ht 69.0 in | Wt 255.0 lb

## 2022-08-12 DIAGNOSIS — F419 Anxiety disorder, unspecified: Secondary | ICD-10-CM

## 2022-08-12 DIAGNOSIS — F32A Depression, unspecified: Secondary | ICD-10-CM | POA: Diagnosis not present

## 2022-08-12 MED ORDER — FLUOXETINE HCL 20 MG PO CAPS: 20.0000 mg | ORAL_CAPSULE | Freq: Every day | ORAL | 1 refills | Status: DC

## 2022-08-12 NOTE — Progress Notes (Signed)
Subjective:    Patient ID: Misty Brown, female    DOB: 1981-04-09, 41 y.o.   MRN: [id number]  Anxiety Symptoms include nervous/anxious behavior.      Misty Brown is a very pleasant 41 y.o. female with a history of hypertension, sleep apnea, anxiety and depression who presents today to discuss anxiety.   Currently not managed on treatment. Over the last several weeks she's noticed a return of her anxiety. She's under a lot of stress with her home life. Symptoms include feeling overwhelmed, chest tightness, mind racing thoughts, sleep disturbance, racing heart, increased worry. Feels like prior anxiety symptoms.   Previously managed on fluoxetine in 2019 for which she did well. She felt this helped reduce her overall anxiety.    Review of Systems  Constitutional:  Positive for fatigue.  Respiratory:  Positive for chest tightness.   Psychiatric/Behavioral:  The patient is nervous/anxious.          Past Medical History:  Diagnosis Date   Anxiety and depression    Elevated blood pressure reading    Gestational diabetes     Social History   Socioeconomic History   Marital status: Married    Spouse name: Not on file   Number of children: Not on file   Years of education: Not on file   Highest education level: Not on file  Occupational History   Not on file  Tobacco Use   Smoking status: Never   Smokeless tobacco: Never  Substance and Sexual Activity   Alcohol use: No   Drug use: No   Sexual activity: Yes  Other Topics Concern   Not on file  Social History Narrative   Married.   3 children.   Works in [employer].   Enjoys spending time with her family.   Social Determinants of Health   Financial Resource Strain: Not on file  Food Insecurity: Not on file  Transportation Needs: Not on file  Physical Activity: Not on file  Stress: Not on file  Social Connections: Not on file  Intimate Partner Violence: Not on file    Past Surgical History:  Procedure  Laterality Date   TUBAL LIGATION N/A 03/23/2017   Procedure: POST PARTUM TUBAL LIGATION;  Surgeon: Essie Hart, MD;  Location: [facility];  Service: Gynecology;  Laterality: N/A;   WISDOM TOOTH EXTRACTION      Family History  Problem Relation Age of Onset   Hypertension Mother    Hyperlipidemia Mother    Heart disease Maternal Grandmother    Heart disease Maternal Grandfather    Stroke Paternal Grandmother     Allergies  Allergen Reactions   Dicyclomine     Abdominal pain    Doxycycline Nausea And Vomiting    Current Outpatient Medications on File Prior to Visit  Medication Sig Dispense Refill   lansoprazole (PREVACID) 15 MG capsule Take 1 capsule (15 mg total) by mouth daily. For heartburn 90 capsule 0   losartan (COZAAR) 50 MG tablet TAKE 1 TABLET (50 MG TOTAL) BY MOUTH DAILY. FOR BLOOD PRESSURE. 90 tablet 2   vitamin B-12 (CYANOCOBALAMIN) 500 MCG tablet Take 500 mcg by mouth daily.     Vitamin D, Cholecalciferol, 25 MCG (1000 UT) TABS Take by mouth.     No current facility-administered medications on file prior to visit.    BP 126/80   Pulse 90   Temp (!) 97.5 F (36.4 C) (Temporal)   Ht 5\' 9"  (1.753 m)   Wt 255 lb (115.7 kg)  SpO2 98%   BMI 37.66 kg/m  Objective:   Physical Exam Cardiovascular:     Rate and Rhythm: Normal rate and regular rhythm.  Pulmonary:     Effort: Pulmonary effort is normal.     Breath sounds: Normal breath sounds.  Musculoskeletal:     Cervical back: Neck supple.  Skin:    General: Skin is warm and dry.           Assessment & Plan:   Problem List Items Addressed This Visit       Other   Anxiety and depression - Primary    Deteriorated.   Discussed options.  Resume fluoxetine at 20 mg daily as she tolerated well previously.  She will update in about 4-6 weeks if no improvement.       Relevant Medications   FLUoxetine (PROZAC) 20 MG capsule       Doreene Nest, NP

## 2022-08-12 NOTE — Patient Instructions (Signed)
Start fluoxetine 20 mg once daily for anxiety/depression. Please update me in 4-6 weeks, especially if no improvement.  It was a pleasure to see you today!

## 2022-08-12 NOTE — Assessment & Plan Note (Signed)
Deteriorated.   Discussed options.  Resume fluoxetine at 20 mg daily as she tolerated well previously.  She will update in about 4-6 weeks if no improvement.

## 2022-09-10 ENCOUNTER — Ambulatory Visit: Payer: BC Managed Care – PPO | Admitting: Primary Care

## 2022-09-10 ENCOUNTER — Encounter: Payer: Self-pay | Admitting: Primary Care

## 2022-09-10 VITALS — BP 148/86 | HR 94 | Temp 97.9°F | Ht 69.0 in | Wt 254.0 lb

## 2022-09-10 DIAGNOSIS — R051 Acute cough: Secondary | ICD-10-CM | POA: Diagnosis not present

## 2022-09-10 LAB — POC COVID19 BINAXNOW: SARS Coronavirus 2 Ag: NEGATIVE

## 2022-09-10 NOTE — Patient Instructions (Signed)
Please update me again in 2 days as discussed.  It was a pleasure to see you today!

## 2022-09-10 NOTE — Addendum Note (Signed)
Addended by: Lonia Blood on: 09/10/2022 03:08 PM   Modules accepted: Orders

## 2022-09-10 NOTE — Assessment & Plan Note (Signed)
Suspect viral etiology with residual cough. Lung sounds on exam today were not convincing for pneumonia.  She declines chest x-ray today.  Covid-19 test today negative.  She is feeling better, so we both agreed her to monitor symptoms and update in 2 days if symptoms do not continue to improve. If no improvement then recommend antibiotic treatment, she agrees.

## 2022-09-10 NOTE — Progress Notes (Signed)
Subjective:    Patient ID: Misty Brown, female    DOB: 1981-08-30, 41 y.o.   MRN: [id number]  Cough Associated symptoms include headaches. Pertinent negatives include no chills or fever.    Misty Brown is a very pleasant 41 y.o. female with a history of sleep apnea, hypertension, fatigue who presents today to discuss cough.  Symptom onset 2 weeks ago after exposure to what she believes to be burning plastic by the [facility] treatment plant. Over the last month the smells from the treatment plant have been worse than usual.  Symptoms began with nasal burning and a smell of plastic. She then developed nosebleeds, chest tightness, "croupy cough", fatigue, and a metal taste in her mouth when she coughs.   She continues to experience a cough which is now dry. She continues to experience some chest tightness, metal taste during a cough, and intermittent headache. Overall her symptoms have improved.   She denies fevers, body aches, abdominal pain. Her daughter has also had an intermittent "croupy" cough. Her mother has also experienced similar symptoms, has had two rounds of prednisone and antibiotics.   She did not test for Covid-19 infection. Her daughter tested negative for Covid-19 one week ago.    Review of Systems  Constitutional:  Positive for fatigue. Negative for chills and fever.  HENT:  Negative for congestion.   Respiratory:  Positive for cough and chest tightness.   Gastrointestinal:  Negative for abdominal pain.  Neurological:  Positive for headaches.         Past Medical History:  Diagnosis Date   Anxiety and depression    Elevated blood pressure reading    Gestational diabetes     Social History   Socioeconomic History   Marital status: Married    Spouse name: Not on file   Number of children: Not on file   Years of education: Not on file   Highest education level: Not on file  Occupational History   Not on file  Tobacco Use   Smoking status: Never    Smokeless tobacco: Never  Substance and Sexual Activity   Alcohol use: No   Drug use: No   Sexual activity: Yes  Other Topics Concern   Not on file  Social History Narrative   Married.   3 children.   Works in [employer].   Enjoys spending time with her family.   Social Determinants of Health   Financial Resource Strain: Not on file  Food Insecurity: Not on file  Transportation Needs: Not on file  Physical Activity: Not on file  Stress: Not on file  Social Connections: Not on file  Intimate Partner Violence: Not on file    Past Surgical History:  Procedure Laterality Date   TUBAL LIGATION N/A 03/23/2017   Procedure: POST PARTUM TUBAL LIGATION;  Surgeon: Essie Hart, MD;  Location: [facility];  Service: Gynecology;  Laterality: N/A;   WISDOM TOOTH EXTRACTION      Family History  Problem Relation Age of Onset   Hypertension Mother    Hyperlipidemia Mother    Heart disease Maternal Grandmother    Heart disease Maternal Grandfather    Stroke Paternal Grandmother     Allergies  Allergen Reactions   Dicyclomine     Abdominal pain    Doxycycline Nausea And Vomiting    Current Outpatient Medications on File Prior to Visit  Medication Sig Dispense Refill   FLUoxetine (PROZAC) 20 MG capsule Take 1 capsule (20 mg total) by mouth daily.  For anxiety 90 capsule 1   lansoprazole (PREVACID) 15 MG capsule Take 1 capsule (15 mg total) by mouth daily. For heartburn 90 capsule 0   losartan (COZAAR) 50 MG tablet TAKE 1 TABLET (50 MG TOTAL) BY MOUTH DAILY. FOR BLOOD PRESSURE. 90 tablet 2   vitamin B-12 (CYANOCOBALAMIN) 500 MCG tablet Take 500 mcg by mouth daily.     Vitamin D, Cholecalciferol, 25 MCG (1000 UT) TABS Take by mouth.     No current facility-administered medications on file prior to visit.    BP (!) 148/86   Pulse 94   Temp 97.9 F (36.6 C) (Temporal)   Ht 5\' 9"  (1.753 m)   Wt 254 lb (115.2 kg)   SpO2 98%   BMI 37.51 kg/m  Objective:   Physical Exam Constitutional:       Appearance: She is not ill-appearing.  HENT:     Right Ear: Ear canal normal. Tympanic membrane is bulging. Tympanic membrane is not erythematous.     Left Ear: Ear canal normal. Tympanic membrane is bulging. Tympanic membrane is not erythematous.     Nose:     Right Sinus: No maxillary sinus tenderness or frontal sinus tenderness.     Left Sinus: No maxillary sinus tenderness or frontal sinus tenderness.     Mouth/Throat:     Pharynx: No posterior oropharyngeal erythema.  Eyes:     Conjunctiva/sclera: Conjunctivae normal.  Cardiovascular:     Rate and Rhythm: Normal rate and regular rhythm.  Pulmonary:     Effort: Pulmonary effort is normal.     Breath sounds: Normal breath sounds. No wheezing or rales.     Comments: Dry cough noted a few times during visit. Musculoskeletal:     Cervical back: Neck supple.  Lymphadenopathy:     Cervical: No cervical adenopathy.  Skin:    General: Skin is warm and dry.           Assessment & Plan:   Problem List Items Addressed This Visit       Other   Acute cough - Primary    Suspect viral etiology with residual cough. Lung sounds on exam today were not convincing for pneumonia.  She declines chest x-ray today.  Covid-19 test today negative.  She is feeling better, so we both agreed her to monitor symptoms and update in 2 days if symptoms do not continue to improve. If no improvement then recommend antibiotic treatment, she agrees.          , NP

## 2022-09-12 DIAGNOSIS — R051 Acute cough: Secondary | ICD-10-CM

## 2022-09-12 MED ORDER — AZITHROMYCIN 250 MG PO TABS: ORAL_TABLET | ORAL | 0 refills | Status: DC

## 2022-12-08 DIAGNOSIS — H52203 Unspecified astigmatism, bilateral: Secondary | ICD-10-CM | POA: Diagnosis not present

## 2022-12-08 DIAGNOSIS — H5213 Myopia, bilateral: Secondary | ICD-10-CM | POA: Diagnosis not present

## 2023-02-05 ENCOUNTER — Other Ambulatory Visit: Payer: Self-pay | Admitting: Primary Care

## 2023-02-05 DIAGNOSIS — F32A Depression, unspecified: Secondary | ICD-10-CM

## 2023-02-05 DIAGNOSIS — I10 Essential (primary) hypertension: Secondary | ICD-10-CM

## 2023-02-05 NOTE — Telephone Encounter (Signed)
Unable to lvm, sent mychart message

## 2023-02-05 NOTE — Telephone Encounter (Signed)
Patient is due for CPE/follow up in late July, this will be required prior to any further refills.  Please schedule, thank you!   

## 2023-02-19 ENCOUNTER — Encounter: Payer: BC Managed Care – PPO | Admitting: Primary Care

## 2023-03-25 ENCOUNTER — Ambulatory Visit (INDEPENDENT_AMBULATORY_CARE_PROVIDER_SITE_OTHER): Payer: BC Managed Care – PPO | Admitting: Primary Care

## 2023-03-25 ENCOUNTER — Encounter: Payer: Self-pay | Admitting: Primary Care

## 2023-03-25 ENCOUNTER — Other Ambulatory Visit: Payer: Self-pay | Admitting: Primary Care

## 2023-03-25 VITALS — BP 128/76 | HR 75 | Temp 97.7°F | Ht 69.0 in | Wt 260.0 lb

## 2023-03-25 DIAGNOSIS — M2550 Pain in unspecified joint: Secondary | ICD-10-CM

## 2023-03-25 DIAGNOSIS — K219 Gastro-esophageal reflux disease without esophagitis: Secondary | ICD-10-CM

## 2023-03-25 DIAGNOSIS — Z1231 Encounter for screening mammogram for malignant neoplasm of breast: Secondary | ICD-10-CM

## 2023-03-25 DIAGNOSIS — Z0001 Encounter for general adult medical examination with abnormal findings: Secondary | ICD-10-CM | POA: Diagnosis not present

## 2023-03-25 DIAGNOSIS — I10 Essential (primary) hypertension: Secondary | ICD-10-CM | POA: Diagnosis not present

## 2023-03-25 DIAGNOSIS — G4730 Sleep apnea, unspecified: Secondary | ICD-10-CM | POA: Diagnosis not present

## 2023-03-25 DIAGNOSIS — G8929 Other chronic pain: Secondary | ICD-10-CM | POA: Diagnosis not present

## 2023-03-25 DIAGNOSIS — F32A Depression, unspecified: Secondary | ICD-10-CM

## 2023-03-25 DIAGNOSIS — F419 Anxiety disorder, unspecified: Secondary | ICD-10-CM | POA: Diagnosis not present

## 2023-03-25 DIAGNOSIS — R7303 Prediabetes: Secondary | ICD-10-CM

## 2023-03-25 DIAGNOSIS — Z0000 Encounter for general adult medical examination without abnormal findings: Secondary | ICD-10-CM | POA: Insufficient documentation

## 2023-03-25 MED ORDER — HYDROCHLOROTHIAZIDE 12.5 MG PO TABS: 12.5000 mg | ORAL_TABLET | Freq: Every day | ORAL | 0 refills | Status: DC

## 2023-03-25 NOTE — Assessment & Plan Note (Signed)
Controlled.  Discussed potentially switching to famotidine 20 mg daily. Continue lansoprazole 50 mg daily for now.

## 2023-03-25 NOTE — Assessment & Plan Note (Signed)
Not currently on treatment.  Encouraged weight loss through healthy diet and exercise.

## 2023-03-25 NOTE — Assessment & Plan Note (Signed)
Immunizations UTD. Pap smear UTD, due in July 2024, discussed with patient.  I offered to complete Pap smear today but she kindly declined. Mammogram due, orders placed.  Discussed the importance of a healthy diet and regular exercise in order for weight loss, and to reduce the risk of further co-morbidity.  Exam stable. Labs pending.  Follow up in 1 year for repeat physical.

## 2023-03-25 NOTE — Assessment & Plan Note (Signed)
Controlled.  Will discontinue losartan due to her side effects.  Start hydrochlorothiazide 12.5 mg daily.   We will plan to see her back in 2-3 weeks for BP check and BMP

## 2023-03-25 NOTE — Progress Notes (Signed)
Subjective:    Patient ID: Misty Brown, female    DOB: November 23, 1980, 42 y.o.   MRN: [id number]  HPI  Misty Brown is a very pleasant 42 y.o. female who presents today for complete physical and follow up of chronic conditions.  She suspects that the combination of her losartan and fluoxetine are causing joint aches. Her joint aches are located to the bilateral elbows with radiation to the shoulders and hands/fingers, also bilateral hips and lower back pain. She has pain when making a fist and writing at times. She has noticed intermittent hand and ankle swelling. This began about 1 month ago, increased over the last few weeks. 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  She stopped taking both fluoxetine and losartan one week ago and she noticed an improvement in joint aches. She resumed both medications about 2 weeks ago and joint aches returned so she moved them to the evening time. She's also stopped fluoxetine and losartan at different times with continued joint aches. Her last dose of either medication was about 4 days ago.   She does feel well managed on fluoxetine for anxiety and depression. It does cause drowsiness. She historically takes it in the morning.   Immunizations: -Tetanus: Completed in 2016  Diet: Fair diet.  Exercise: Recently resumed exercise  Eye exam: Completed several months ago.  Dental exam: Completes semi-annually    Pap Smear: July 2021 per GYN Mammogram: Never completed   BP Readings from Last 3 Encounters:  03/25/23 128/76  09/10/22 (!) 148/86  08/12/22 126/80   Wt Readings from Last 3 Encounters:  03/25/23 260 lb (117.9 kg)  09/10/22 254 lb (115.2 kg)  08/12/22 255 lb (115.7 kg)      Review of Systems  Constitutional:  Negative for unexpected weight change.  HENT:  Negative for rhinorrhea.   Respiratory:  Negative for cough and shortness of breath.   Cardiovascular:  Negative for chest pain.  Gastrointestinal:  Negative for  constipation and diarrhea.  Genitourinary:  Negative for difficulty urinating.  Musculoskeletal:  Positive for arthralgias and back pain.  Skin:  Negative for rash.  Allergic/Immunologic: Negative for environmental allergies.  Neurological:  Negative for dizziness and headaches.  Psychiatric/Behavioral:  The patient is not nervous/anxious.          Past Medical History:  Diagnosis Date   Anxiety and depression    Elevated blood pressure reading    Gestational diabetes     Social History   Socioeconomic History   Marital status: Married    Spouse name: Not on file   Number of children: Not on file   Years of education: Not on file   Highest education level: Not on file  Occupational History   Not on file  Tobacco Use   Smoking status: Never   Smokeless tobacco: Never  Substance and Sexual Activity   Alcohol use: No   Drug use: No   Sexual activity: Yes  Other Topics Concern   Not on file  Social History Narrative   Married.   3 children.   Works in [employer].   Enjoys spending time with her family.   Social Determinants of Health   Financial Resource Strain: Not on file  Food Insecurity: Not on file  Transportation Needs: Not on file  Physical Activity: Not on file  Stress: Not on file  Social Connections: Not on file  Intimate Partner Violence: Not on file    Past Surgical History:  Procedure Laterality Date   TUBAL LIGATION N/A 03/23/2017   Procedure: POST PARTUM TUBAL LIGATION;  Surgeon: Essie Hart, MD;  Location: [facility];  Service: Gynecology;  Laterality: N/A;   WISDOM TOOTH EXTRACTION      Family History  Problem Relation Age of Onset   Hypertension Mother    Hyperlipidemia Mother    Heart murmur Mother    Heart disease Maternal Grandmother    Heart disease Maternal Grandfather    Stroke Paternal Grandmother     Allergies  Allergen Reactions   Dicyclomine     Abdominal pain    Doxycycline Nausea And Vomiting    Current Outpatient  Medications on File Prior to Visit  Medication Sig Dispense Refill   FLUoxetine (PROZAC) 20 MG capsule TAKE 1 CAPSULE BY MOUTH DAILY FOR ANXIETY 90 capsule 0   lansoprazole (PREVACID) 15 MG capsule Take 1 capsule (15 mg total) by mouth daily. For heartburn 90 capsule 0   vitamin B-12 (CYANOCOBALAMIN) 500 MCG tablet Take 500 mcg by mouth daily.     Vitamin D, Cholecalciferol, 25 MCG (1000 UT) TABS Take by mouth.     No current facility-administered medications on file prior to visit.    BP 128/76   Pulse 75   Temp 97.7 F (36.5 C) (Temporal)   Ht 5\' 9"  (1.753 m)   Wt 260 lb (117.9 kg)   SpO2 98%   BMI 38.40 kg/m  Objective:   Physical Exam HENT:     Right Ear: Tympanic membrane and ear canal normal.     Left Ear: Tympanic membrane and ear canal normal.     Nose: Nose normal.  Eyes:     Conjunctiva/sclera: Conjunctivae normal.     Pupils: Pupils are equal, round, and reactive to light.  Neck:     Thyroid: No thyromegaly.  Cardiovascular:     Rate and Rhythm: Normal rate and regular rhythm.     Heart sounds: No murmur heard. Pulmonary:     Effort: Pulmonary effort is normal.     Breath sounds: Normal breath sounds. No rales.  Abdominal:     General: Bowel sounds are normal.     Palpations: Abdomen is soft.     Tenderness: There is no abdominal tenderness.  Musculoskeletal:        General: Normal range of motion.     Cervical back: Neck supple.  Lymphadenopathy:     Cervical: No cervical adenopathy.  Skin:    General: Skin is warm and dry.     Findings: No rash.  Neurological:     Mental Status: She is alert and oriented to person, place, and time.     Cranial Nerves: No cranial nerve deficit.     Deep Tendon Reflexes: Reflexes are normal and symmetric.  Psychiatric:        Mood and Affect: Mood normal.           Assessment & Plan:  Encounter for annual general medical examination with abnormal findings in adult Assessment & Plan: Immunizations UTD. Pap  smear UTD, due in July 2024, discussed with patient.  I offered to complete Pap smear today but she kindly declined. Mammogram due, orders placed.  Discussed the importance of a healthy diet and regular exercise in order for weight loss, and to reduce the risk of further co-morbidity.  Exam stable. Labs pending.  Follow up in 1 year for repeat physical.    Screening mammogram for breast cancer  Essential hypertension Assessment &  Plan: Controlled.  Will discontinue losartan due to her side effects.  Start hydrochlorothiazide 12.5 mg daily.   We will plan to see her back in 2-3 weeks for BP check and BMP  Orders: -     hydroCHLOROthiazide; Take 1 tablet (12.5 mg total) by mouth daily. for blood pressure.  Dispense: 90 tablet; Refill: 0 -     Lipid panel -     Comprehensive metabolic panel  Sleep apnea, unspecified type Assessment & Plan: Not currently on treatment.  Encouraged weight loss through healthy diet and exercise.   Anxiety and depression Assessment & Plan: Controlled.  Unlikely that fluoxetine is causing joint aches, but we will keep this in mind. For now we will continue fluoxetine 20 mg.  She will move it to nighttime to avoid daytime drowsiness.  Continue to monitor.   Chronic joint pain Assessment & Plan: It is possible that losartan could be contributing to some of her symptoms, but not entirely.  Stop losartan.  Switch to hydrochlorothiazide.  Checking labs today including CCP, RF, sed rate, CRP, uric acid.  Await results.   Orders: -     Uric acid -     C-reactive protein -     Sedimentation rate -     Cyclic citrul peptide antibody, IgG -     Rheumatoid factor  Prediabetes Assessment & Plan: Repeat A1c pending.  Orders: -     Hemoglobin A1c -     Comprehensive metabolic panel  Gastroesophageal reflux disease, unspecified whether esophagitis present Assessment & Plan: Controlled.  Discussed potentially switching to famotidine  20 mg daily. Continue lansoprazole 50 mg daily for now.         Doreene Nest, NP

## 2023-03-25 NOTE — Assessment & Plan Note (Signed)
Controlled.  Unlikely that fluoxetine is causing joint aches, but we will keep this in mind. For now we will continue fluoxetine 20 mg.  She will move it to nighttime to avoid daytime drowsiness.  Continue to monitor.

## 2023-03-25 NOTE — Patient Instructions (Signed)
Stop by the lab prior to leaving today. I will notify you of your results once received.   Remain off of losartan for blood pressure.  Start hydrochlorothiazide 12.5 mg daily for blood pressure.  Resume fluoxetine for anxiety/depression and take it at night.  [facility]   I am happy to complete your Pap smear when ready.  It was a pleasure to see you today!

## 2023-03-25 NOTE — Assessment & Plan Note (Signed)
Repeat A1c pending. 

## 2023-03-25 NOTE — Assessment & Plan Note (Signed)
It is possible that losartan could be contributing to some of her symptoms, but not entirely.  Stop losartan.  Switch to hydrochlorothiazide.  Checking labs today including CCP, RF, sed rate, CRP, uric acid.  Await results.

## 2023-03-26 LAB — COMPREHENSIVE METABOLIC PANEL
ALT: 39 U/L — ABNORMAL HIGH (ref 0–35)
AST: 33 U/L (ref 0–37)
Albumin: 4.2 g/dL (ref 3.5–5.2)
Alkaline Phosphatase: 61 U/L (ref 39–117)
BUN: 10 mg/dL (ref 6–23)
CO2: 27 mEq/L (ref 19–32)
Calcium: 9.3 mg/dL (ref 8.4–10.5)
Chloride: 100 mEq/L (ref 96–112)
Creatinine, Ser: 0.68 mg/dL (ref 0.40–1.20)
GFR: 108.21 mL/min (ref >60.00)
Glucose, Bld: 115 mg/dL — ABNORMAL HIGH (ref 70–99)
Potassium: 4.1 mEq/L (ref 3.5–5.1)
Sodium: 135 mEq/L (ref 135–145)
Total Bilirubin: 0.3 mg/dL (ref 0.2–1.2)
Total Protein: 7 g/dL (ref 6.0–8.3)

## 2023-03-26 LAB — LIPID PANEL
Cholesterol: 133 mg/dL (ref 0–200)
HDL: 37 mg/dL — ABNORMAL LOW (ref >39.00)
LDL Cholesterol: 68 mg/dL (ref 0–99)
NonHDL: 95.99
Total CHOL/HDL Ratio: 4
Triglycerides: 139 mg/dL (ref 0.0–149.0)
VLDL: 27.8 mg/dL (ref 0.0–40.0)

## 2023-03-26 LAB — RHEUMATOID FACTOR: Rheumatoid fact SerPl-aCnc: <10 IU/mL (ref <14)

## 2023-03-26 LAB — URIC ACID: Uric Acid, Serum: 5 mg/dL (ref 2.4–7.0)

## 2023-03-26 LAB — HEMOGLOBIN A1C: Hgb A1c MFr Bld: 6 % (ref 4.6–6.5)

## 2023-03-26 LAB — C-REACTIVE PROTEIN: CRP: 2 mg/dL (ref 0.5–20.0)

## 2023-03-26 LAB — SEDIMENTATION RATE: Sed Rate: 33 mm/hr — ABNORMAL HIGH (ref 0–20)

## 2023-03-27 DIAGNOSIS — R7303 Prediabetes: Secondary | ICD-10-CM

## 2023-03-27 LAB — CYCLIC CITRUL PEPTIDE ANTIBODY, IGG: Cyclic Citrullin Peptide Ab: <16 UNITS

## 2023-03-30 MED ORDER — METFORMIN HCL ER 500 MG PO TB24
500.0000 mg | ORAL_TABLET | Freq: Every day | ORAL | 0 refills | Status: DC
Start: 2023-03-30 — End: 2023-06-25

## 2023-04-17 ENCOUNTER — Ambulatory Visit: Payer: BC Managed Care – PPO

## 2023-04-27 ENCOUNTER — Ambulatory Visit
Admission: RE | Admit: 2023-04-27 | Discharge: 2023-04-27 | Disposition: A | Discharge status: Home / Self Care | Payer: BC Managed Care – PPO | Source: Ambulatory Visit | Attending: Primary Care | Admitting: Primary Care

## 2023-04-27 DIAGNOSIS — Z1231 Encounter for screening mammogram for malignant neoplasm of breast: Secondary | ICD-10-CM | POA: Diagnosis not present

## 2023-05-06 ENCOUNTER — Other Ambulatory Visit: Payer: Self-pay | Admitting: Primary Care

## 2023-05-06 DIAGNOSIS — F32A Anxiety disorder, unspecified: Secondary | ICD-10-CM

## 2023-05-06 DIAGNOSIS — I10 Essential (primary) hypertension: Secondary | ICD-10-CM

## 2023-06-20 ENCOUNTER — Other Ambulatory Visit: Payer: Self-pay | Admitting: Primary Care

## 2023-06-20 DIAGNOSIS — R7303 Prediabetes: Secondary | ICD-10-CM

## 2023-06-21 NOTE — Telephone Encounter (Signed)
Please call patient:  Is she still taking the metformin tablet every day for her prediabetes?  If so then I would like to see her in the office for repeat A1c and follow-up to see how she is doing.  Let me know.

## 2023-06-22 NOTE — Telephone Encounter (Signed)
Unable to reach patient. Left voicemail to return call to our office.

## 2023-06-24 NOTE — Telephone Encounter (Signed)
Spoke to pt, pt states she is still taking metformin every day. Scheduled pt for f/u for 07/03/23. Call back # [phone]

## 2023-06-24 NOTE — Telephone Encounter (Signed)
Unable to reach patient. Left voicemail to return call to our office.   

## 2023-07-03 ENCOUNTER — Ambulatory Visit: Payer: BC Managed Care – PPO | Admitting: Primary Care

## 2023-07-08 ENCOUNTER — Ambulatory Visit: Payer: BC Managed Care – PPO | Admitting: Primary Care

## 2023-07-22 ENCOUNTER — Encounter: Payer: Self-pay | Admitting: Primary Care

## 2023-07-22 ENCOUNTER — Ambulatory Visit: Payer: BC Managed Care – PPO | Admitting: Primary Care

## 2023-07-22 VITALS — BP 110/78 | HR 68 | Temp 97.3°F | Ht 69.0 in | Wt 252.0 lb

## 2023-07-22 DIAGNOSIS — R7303 Prediabetes: Secondary | ICD-10-CM | POA: Diagnosis not present

## 2023-07-22 DIAGNOSIS — R519 Headache, unspecified: Secondary | ICD-10-CM | POA: Diagnosis not present

## 2023-07-22 LAB — POCT GLYCOSYLATED HEMOGLOBIN (HGB A1C): Hemoglobin A1C: 6 % — AB (ref 4.0–5.6)

## 2023-07-22 NOTE — Progress Notes (Signed)
Subjective:    Patient ID: Misty Brown, female    DOB: 1981-02-26, 42 y.o.   MRN: [id number]  Diabetes Hypoglycemia symptoms include headaches. Pertinent negatives for diabetes include no chest pain.    Misty Brown is a very pleasant 42 y.o. female with a history of hypertension, prediabetes, fatigue, sleep apnea who presents today for follow-up of prediabetes.  1) Prediabetes: She is currently managed on metformin XR 500 mg once daily for prediabetes it was initiated in late June 2024 for A1c of 6.0.   Since her last visit she is compliant to metformin XR 500 mg daily. She has lost 8 pounds. She has reduced intake of carbs. She has increased consumption of vegetables and is making better choices.   Wt Readings from Last 3 Encounters:  07/22/23 252 lb (114.3 kg)  03/25/23 260 lb (117.9 kg)  09/10/22 254 lb (115.2 kg)   BP Readings from Last 3 Encounters:  07/22/23 110/78  03/25/23 128/76  09/10/22 (!) 148/86   2) Headaches: No prior diagnosis. Prior history of headaches since the endometrial ablation, lasted a few months, then abated.   Acute since starting metformin in July 2024. Headaches are intermittent and occurred 1-2 times weekly which last for a few hours. Headaches are located either to the frontal or occipital lobes, sometimes bilateral. About 4-5 days her most recent headache began which is now located to the bilateral frontal lobes, sometimes to the maxillary region bilaterally.  Sometimes notices a "wavy" sensation of rocking on a boat.   She's been taking Excedrin Migraine or Tylenol as needed, but over the last few days she's been taking them daily. She denies photophobia, nausea, phonophobia, history of migraines, family history of migraines.   Review of Systems  Eyes:  Negative for photophobia and visual disturbance.  Respiratory:  Negative for shortness of breath.   Cardiovascular:  Negative for chest pain.  Gastrointestinal:  Negative for abdominal pain,  diarrhea and nausea.  Neurological:  Positive for headaches.         Past Medical History:  Diagnosis Date   Anxiety and depression    Elevated blood pressure reading    Gestational diabetes     Social History   Socioeconomic History   Marital status: Married    Spouse name: Not on file   Number of children: Not on file   Years of education: Not on file   Highest education level: Not on file  Occupational History   Not on file  Tobacco Use   Smoking status: Never   Smokeless tobacco: Never  Substance and Sexual Activity   Alcohol use: No   Drug use: No   Sexual activity: Yes  Other Topics Concern   Not on file  Social History Narrative   Married.   3 children.   Works in [employer].   Enjoys spending time with her family.   Social Determinants of Health   Financial Resource Strain: Not on file  Food Insecurity: Not on file  Transportation Needs: Not on file  Physical Activity: Not on file  Stress: Not on file  Social Connections: Unknown (02/11/2022)   Received from [facility]   Social Network    Social Network: Not on file  Intimate Partner Violence: Unknown (01/03/2022)   Received from [facility], [facility]   HITS    Physically Hurt: Not on file    Insult or Talk Down To: Not on file    Threaten Physical Harm: Not on  file    Scream or Curse: Not on file    Past Surgical History:  Procedure Laterality Date   TUBAL LIGATION N/A 03/23/2017   Procedure: POST PARTUM TUBAL LIGATION;  Surgeon: Essie Hart, MD;  Location: [facility];  Service: Gynecology;  Laterality: N/A;   WISDOM TOOTH EXTRACTION      Family History  Problem Relation Age of Onset   Hypertension Mother    Hyperlipidemia Mother    Heart murmur Mother    Heart disease Maternal Grandmother    Heart disease Maternal Grandfather    Stroke Paternal Grandmother     Allergies  Allergen Reactions   Dicyclomine     Abdominal pain    Doxycycline Nausea And Vomiting     Current Outpatient Medications on File Prior to Visit  Medication Sig Dispense Refill   FLUoxetine (PROZAC) 20 MG capsule TAKE 1 CAPSULE BY MOUTH EVERY DAY FOR ANXIETY 90 capsule 2   hydrochlorothiazide (HYDRODIURIL) 12.5 MG tablet Take 1 tablet (12.5 mg total) by mouth daily. for blood pressure. 90 tablet 0   lansoprazole (PREVACID) 15 MG capsule Take 1 capsule (15 mg total) by mouth daily. For heartburn 90 capsule 0   metFORMIN (GLUCOPHAGE-XR) 500 MG 24 hr tablet TAKE 1 TABLET (500 MG TOTAL) BY MOUTH DAILY WITH BREAKFAST. FOR PREDIABETES 90 tablet 0   vitamin B-12 (CYANOCOBALAMIN) 500 MCG tablet Take 500 mcg by mouth daily.     Vitamin D, Cholecalciferol, 25 MCG (1000 UT) TABS Take by mouth.     No current facility-administered medications on file prior to visit.    BP 110/78 (BP Location: Left Arm, Patient Position: Sitting, Cuff Size: Large)   Pulse 68   Temp (!) 97.3 F (36.3 C) (Temporal)   Ht 5\' 9"  (1.753 m)   Wt 252 lb (114.3 kg)   SpO2 94%   BMI 37.21 kg/m  Objective:   Physical Exam Eyes:     Extraocular Movements: Extraocular movements intact.  Cardiovascular:     Rate and Rhythm: Normal rate and regular rhythm.  Pulmonary:     Effort: Pulmonary effort is normal.     Breath sounds: Normal breath sounds.  Musculoskeletal:     Cervical back: Neck supple.  Skin:    General: Skin is warm and dry.  Neurological:     Mental Status: She is alert and oriented to person, place, and time.     Cranial Nerves: No cranial nerve deficit.     Coordination: Coordination normal.  Psychiatric:        Mood and Affect: Mood normal.           Assessment & Plan:  Prediabetes Assessment & Plan: Stable w/ A1c 6.0, same as prior.   Stop metformin 500 mg daily for 2 weeks due to possible side effect of headaches.  Discussed continuing healthy diet and lifestyle changes.  She will message via MyChart in 2 weeks with how her headaches are doing.   I evaluated patient,  was consulted regarding treatment, and agree with assessment and plan per Tenna Delaine, RN, DNP student.   Mayra Reel, NP-C   Orders: -     POCT glycosylated hemoglobin (Hb A1C)  Nonintractable headache, unspecified chronicity pattern, unspecified headache type Assessment & Plan: New onset since July. Neuro exam unremarkable.   Suspect metformin could be causing symptoms. Stop Metformin 500 mg daily for 2 weeks due to possible side effect of headaches.   Stop Excedrin, discuss risks of rebound headaches. Continue OTC  Tylenol PRN and nonpharmacologic therapies.  She will update via MyChart in 2 weeks.   I evaluated patient, was consulted regarding treatment, and agree with assessment and plan per Tenna Delaine, RN, DNP student.   Mayra Reel, NP-C          Doreene Nest, NP

## 2023-07-22 NOTE — Assessment & Plan Note (Addendum)
Stable w/ A1c 6.0, same as prior.   Stop metformin 500 mg daily for 2 weeks due to possible side effect of headaches.  Discussed continuing healthy diet and lifestyle changes.  She will message via MyChart in 2 weeks with how her headaches are doing.   I evaluated patient, was consulted regarding treatment, and agree with assessment and plan per Tenna Delaine, RN, DNP student.   Misty Reel, NP-C

## 2023-07-22 NOTE — Assessment & Plan Note (Addendum)
New onset since July. Neuro exam unremarkable.   Suspect metformin could be causing symptoms. Stop Metformin 500 mg daily for 2 weeks due to possible side effect of headaches.   Stop Excedrin, discussed risks of rebound headaches. Continue OTC Tylenol PRN and nonpharmacologic therapies.  She will update via MyChart in 2 weeks.   I evaluated patient, was consulted regarding treatment, and agree with assessment and plan per Tenna Delaine, RN, DNP student.   Mayra Reel, NP-C

## 2023-07-22 NOTE — Patient Instructions (Addendum)
Stop Metformin 500 mg daily for 2 weeks due to possible side effect of headaches.  Stop using Excedrin as it can cause rebound headaches.  You can continue using over the counter Tylenol as needed.   Continue healthy diet and lifestyle changes.   Message me via MyChart in 2 weeks to let me know how your headaches are.  Schedule your annual physical for June 2025.   It was a pleasure to see you today!

## 2023-07-22 NOTE — Progress Notes (Signed)
Established Patient Office Visit  Subjective   Patient ID: Misty Brown, female    DOB: September 13, 1981  Age: 42 y.o. MRN: [id number]  Chief Complaint  Patient presents with   Diabetes    Diabetes Hypoglycemia symptoms include headaches. Pertinent negatives for hypoglycemia include no dizziness or nervousness/anxiousness. Pertinent negatives for diabetes include no blurred vision and no chest pain.    Misty Brown is a very pleasant 42 y.o. female with a history of hypertension, prediabetes, anxiety and depression who presents today for follow up of prediabetes.   Current medications include: Metformin 500 mg daily  She is not checking her blood glucose at home. She has lost 8 lb since her last visit. Her previous A1c 3 months ago was 6.0. Today A1c is also 6.0. She has been compliant to her Metformin 500 mg daily, which was prescribed in June 2024. Her eye exam is UTD. She has glasses now and this has greatly helped with her vision. She declines flu shot.   Additionally, she has been having frequent headaches since July after initiating Metformin. They are located bilateral frontal region to bilateral occipital region. She takes Tylenol and Excedrin for this. She also uses an Customer service manager which helps. The headaches come and go. She has a history of headaches after uterine ablation 5 years ago, but they have increased since July. She denies photophobia, phonophobia, nausea.   Dietary changes since last visit: She is drinking water and trying not to overeat. She is reducing breads and sugars.   Exercise: No regular exercise.    Review of Systems  Constitutional:  Negative for chills and fever.  Eyes:  Negative for blurred vision and double vision.       Wears glasses now which helps with distance vision  Respiratory:  Negative for shortness of breath.   Cardiovascular:  Negative for chest pain.  Gastrointestinal:  Negative for diarrhea, nausea and vomiting.  Musculoskeletal:   Negative for myalgias.  Neurological:  Positive for headaches. Negative for dizziness, sensory change and focal weakness.  Psychiatric/Behavioral:  Negative for depression. The patient is not nervous/anxious.      Objective:     BP 110/78 (BP Location: Left Arm, Patient Position: Sitting, Cuff Size: Large)   Pulse 68   Temp (!) 97.3 F (36.3 C) (Temporal)   Ht 5\' 9"  (1.753 m)   Wt 252 lb (114.3 kg)   SpO2 94%   BMI 37.21 kg/m   BP Readings from Last 3 Encounters:  07/22/23 110/78  03/25/23 128/76  09/10/22 (!) 148/86   Wt Readings from Last 3 Encounters:  07/22/23 252 lb (114.3 kg)  03/25/23 260 lb (117.9 kg)  09/10/22 254 lb (115.2 kg)    Physical Exam Cardiovascular:     Rate and Rhythm: Normal rate and regular rhythm.  Pulmonary:     Effort: Pulmonary effort is normal.     Breath sounds: Normal breath sounds.  Skin:    General: Skin is warm and dry.  Neurological:     General: No focal deficit present.     Mental Status: She is alert and oriented to person, place, and time.     Cranial Nerves: No cranial nerve deficit.     Sensory: No sensory deficit.     Motor: No weakness.     Coordination: Coordination normal.     Results for orders placed or performed in visit on 07/22/23  POCT glycosylated hemoglobin (Hb A1C)  Result Value Ref Range   Hemoglobin A1C  6.0 (A) 4.0 - 5.6 %   HbA1c POC (<> result, manual entry)     HbA1c, POC (prediabetic range)     HbA1c, POC (controlled diabetic range)        The 10-year ASCVD risk score (Arnett DK, et al., 2019) is: 0.6%    Assessment & Plan:   Problem List Items Addressed This Visit       Other   Prediabetes - Primary    Stable w/ A1c 6.0, same as prior.   Stop metformin 500 mg daily for 2 weeks due to possible side effect of headaches.  Discussed continuing healthy diet and lifestyle changes.  She will message via MyChart in 2 weeks with how her headaches are doing.   I evaluated patient, was  consulted regarding treatment, and agree with assessment and plan per Misty Delaine, RN, DNP student.   Misty Reel, NP-C       Relevant Orders   POCT glycosylated hemoglobin (Hb A1C) (Completed)   Headache    New onset since July. Neuro exam unremarkable.   Suspect metformin could be causing symptoms. Stop Metformin 500 mg daily for 2 weeks due to possible side effect of headaches.   Stop Excedrin, discussed risks of rebound headaches. Continue OTC Tylenol PRN and nonpharmacologic therapies.  She will update via MyChart in 2 weeks.   I evaluated patient, was consulted regarding treatment, and agree with assessment and plan per Misty Delaine, RN, DNP student.   Misty Reel, NP-C        No follow-ups on file.    Misty Mccreedy, RN

## 2023-08-03 ENCOUNTER — Other Ambulatory Visit: Payer: Self-pay | Admitting: Primary Care

## 2023-08-03 DIAGNOSIS — I10 Essential (primary) hypertension: Secondary | ICD-10-CM

## 2023-10-20 ENCOUNTER — Ambulatory Visit: Payer: BC Managed Care – PPO | Admitting: Primary Care

## 2023-10-20 ENCOUNTER — Encounter: Payer: Self-pay | Admitting: Primary Care

## 2023-10-20 VITALS — BP 118/62 | HR 82 | Temp 97.0°F | Ht 69.0 in | Wt 265.0 lb

## 2023-10-20 DIAGNOSIS — G8929 Other chronic pain: Secondary | ICD-10-CM | POA: Diagnosis not present

## 2023-10-20 DIAGNOSIS — M5441 Lumbago with sciatica, right side: Secondary | ICD-10-CM | POA: Diagnosis not present

## 2023-10-20 MED ORDER — PREDNISONE 20 MG PO TABS
ORAL_TABLET | ORAL | 0 refills | Status: DC
Start: 2023-10-20 — End: 2023-10-30

## 2023-10-20 NOTE — Progress Notes (Signed)
   Acute Office Visit  Subjective:     Patient ID: Misty Brown, female    DOB: July 10, 1981, 43 y.o.   MRN: [id number]  Chief Complaint  Patient presents with   Hip Pain    Right sided hip pain flared up this last week      Misty Brown is a 43 year old female with history of chronic joint pain, lower extremity weakness, peripheral neuropathy. She presents today to discuss right-sided lower back pain. Pain began 7 days ago with acute onset, no triggers. Pain radiating down back of leg, into bottom of feet. Pain occasionally radiates across back to left side. Pain described as shooting, throbbing, burning pain. Describes episodic flares of back pain, radiating down leg that are relieved with OTC tylenol. Tylenol has not relieved symptoms during this episode. Pain occurs with minimal exertion/movement, not limiting daily activities. Denies falls or recent injury.    Review of Systems  Gastrointestinal: Negative.   Genitourinary: Negative.   Musculoskeletal:  Positive for back pain.       Chronic lower back pain with episodic flares. Radiating down back of leg into feet. Misty Brown several years ago down stairs with back injury. Sees chiropractor.   Neurological:  Negative for tingling and sensory change.        Objective:    BP 118/62   Pulse 82   Temp (!) 97 F (36.1 C) (Temporal)   Ht 5\' 9"  (1.753 m)   Wt 120.2 kg   SpO2 98%   BMI 39.13 kg/m    Physical Exam Constitutional:      Appearance: Normal appearance.  Musculoskeletal:     Lumbar back: Decreased range of motion.     Comments: Pain with hip flexion, knee flexion limiting ROM  Neurological:     Mental Status: She is alert.     Sensory: Sensation is intact.     Coordination: Coordination is intact.     Comments: Decreased strength of RLE, limited by pain.     No results found for any visits on 10/20/23.      Assessment & Plan:   Problem List Items Addressed This Visit       Nervous and Auditory   Chronic  right-sided low back pain with right-sided sciatica - Primary   HPI/PE suggestive of acute on chronic right-sided low back pain with right-sided sciatica. No alarm signs during exam or HPI.  Start prednisone 40mg  PO QAM x4 days then decrease to 20mg  QAM x4 days.  OTC ibuprofen 400-600mg  Q6H PRN  Follow up as needing, pending symptom relief.  Offered IM Toradol. Patient declines.  Discussed Meloxicam Rx. Patient declines.  She will update.  I evaluated patient, was consulted regarding treatment, and agree with assessment and plan per Julaine Fusi, MSN, FNP student.   Mayra Reel, NP-C       Relevant Medications   predniSONE (DELTASONE) 20 MG tablet    Meds ordered this encounter  Medications   predniSONE (DELTASONE) 20 MG tablet    Sig: Take 2 tablets by mouth daily in the morning for 4 days. Then 1 tablet daily for 4 days.    Dispense:  12 tablet    Refill:  0   Disposition:   Keep routine appointment as scheduled. Follow up as needed, pending symptoms.   Lindell Spar, RN

## 2023-10-20 NOTE — Patient Instructions (Addendum)
Start prednisone 2 tablets by mouth daily in the morning for 4 days. Then decrease to 1 tablet daily for 4 days.   OK to use ibuprofen 400-600mg  every 6 hours as needed, over-the-counter.  If no symptom improvement, please message office.   Hope you feel well soon. It was a pleasure meeting you today.

## 2023-10-20 NOTE — Progress Notes (Signed)
Subjective:    Patient ID: Misty Brown, female    DOB: 03-31-1981, 43 y.o.   MRN: [id number]  Hip Pain  Pertinent negatives include no numbness.    Misty Brown is a very pleasant 43 y.o. female with a history of chronic joint pain, lower extremity weakness, hypertension, peripheral neuropathy who presents today to discuss back pain.   Acute on chronic flare. He recent flare began about 5-6 days ago, unprovoked. Her pain is located to the right upper buttocks with radiation down her right lower extremity to the feet. She describes her pain as shooting or throbbing. The pain has not limited her typical daily activities, but she does push through the pain.   She fell several years ago going down her basement stairs, landed on her right lower back, has had intermittent back pain flares to the right lower back. These symptoms are her typical back pain flare but more intense.   She's recently taken Tylenol without improvement.   She denies numbness, loss of bladder or bowel control, injury/trauma, overuse.    Review of Systems  Genitourinary:        Denies loss of bowel/bladder control  Musculoskeletal:  Positive for arthralgias and back pain.  Skin:  Negative for color change.  Neurological:  Negative for numbness.         Past Medical History:  Diagnosis Date   Anxiety and depression    Elevated blood pressure reading    Gestational diabetes     Social History   Socioeconomic History   Marital status: Married    Spouse name: Not on file   Number of children: Not on file   Years of education: Not on file   Highest education level: Not on file  Occupational History   Not on file  Tobacco Use   Smoking status: Never   Smokeless tobacco: Never  Substance and Sexual Activity   Alcohol use: No   Drug use: No   Sexual activity: Yes  Other Topics Concern   Not on file  Social History Narrative   Married.   3 children.   Works in [employer].   Enjoys spending time with  her family.   Social Drivers of Corporate investment banker Strain: Not on file  Food Insecurity: Not on file  Transportation Needs: Not on file  Physical Activity: Not on file  Stress: Not on file  Social Connections: Unknown (02/11/2022)   Received from [facility], [facility]   Social Network    Social Network: Not on file  Intimate Partner Violence: Unknown (01/03/2022)   Received from [facility], [facility]   HITS    Physically Hurt: Not on file    Insult or Talk Down To: Not on file    Threaten Physical Harm: Not on file    Scream or Curse: Not on file    Past Surgical History:  Procedure Laterality Date   TUBAL LIGATION N/A 03/23/2017   Procedure: POST PARTUM TUBAL LIGATION;  Surgeon: Essie Hart, MD;  Location: [facility];  Service: Gynecology;  Laterality: N/A;   WISDOM TOOTH EXTRACTION      Family History  Problem Relation Age of Onset   Hypertension Mother    Hyperlipidemia Mother    Heart murmur Mother    Heart disease Maternal Grandmother    Heart disease Maternal Grandfather    Stroke Paternal Grandmother     Allergies  Allergen Reactions   Dicyclomine     Abdominal pain  Doxycycline Nausea And Vomiting    Current Outpatient Medications on File Prior to Visit  Medication Sig Dispense Refill   FLUoxetine (PROZAC) 20 MG capsule TAKE 1 CAPSULE BY MOUTH EVERY DAY FOR ANXIETY 90 capsule 2   hydrochlorothiazide (HYDRODIURIL) 12.5 MG tablet TAKE 1 TABLET (12.5 MG TOTAL) BY MOUTH DAILY FOR BLOOD PRESSURE 90 tablet 1   lansoprazole (PREVACID) 15 MG capsule Take 1 capsule (15 mg total) by mouth daily. For heartburn 90 capsule 0   metFORMIN (GLUCOPHAGE-XR) 500 MG 24 hr tablet TAKE 1 TABLET (500 MG TOTAL) BY MOUTH DAILY WITH BREAKFAST. FOR PREDIABETES 90 tablet 0   vitamin B-12 (CYANOCOBALAMIN) 500 MCG tablet Take 500 mcg by mouth daily.     Vitamin D, Cholecalciferol, 25 MCG (1000 UT) TABS Take by mouth.     No current facility-administered medications  on file prior to visit.    BP 118/62   Pulse 82   Temp (!) 97 F (36.1 C) (Temporal)   Ht 5\' 9"  (1.753 m)   Wt 265 lb (120.2 kg)   SpO2 98%   BMI 39.13 kg/m  Objective:   Physical Exam Constitutional:      General: She is not in acute distress. Cardiovascular:     Rate and Rhythm: Normal rate.  Pulmonary:     Effort: Pulmonary effort is normal.  Musculoskeletal:     Lumbar back: No tenderness or bony tenderness. Normal range of motion. Negative right straight leg raise test and negative left straight leg raise test.       Back:           Assessment & Plan:  Chronic right-sided low back pain with right-sided sciatica Assessment & Plan: HPI/PE suggestive of acute on chronic right-sided low back pain with right-sided sciatica. No alarm signs during exam or HPI.  Start prednisone 40mg  PO QAM x4 days then decrease to 20mg  QAM x4 days.  OTC ibuprofen 400-600mg  Q4H PRN  Follow up as needing, pending symptom relief.  Offered IM Toradol. Patient declines.  Discussed Meloxicam Rx. Patient declines.  She will update.  I evaluated patient, was consulted regarding treatment, and agree with assessment and plan per Julaine Fusi, MSN, FNP student.   Mayra Reel, NP-C   Orders: -     predniSONE; Take 2 tablets by mouth daily in the morning for 4 days. Then 1 tablet daily for 4 days.  Dispense: 12 tablet; Refill: 0        Doreene Nest, NP

## 2023-10-20 NOTE — Assessment & Plan Note (Addendum)
HPI/PE suggestive of acute on chronic right-sided low back pain with right-sided sciatica. No alarm signs during exam or HPI.  Start prednisone 40mg  PO QAM x4 days then decrease to 20mg  QAM x4 days.  OTC ibuprofen 400-600mg  Q6H PRN  Follow up as needing, pending symptom relief.  Offered IM Toradol. Patient declines.  Discussed Meloxicam Rx. Patient declines.  She will update.  I evaluated patient, was consulted regarding treatment, and agree with assessment and plan per Julaine Fusi, MSN, FNP student.   Misty Reel, NP-C

## 2023-10-27 DIAGNOSIS — G8929 Other chronic pain: Secondary | ICD-10-CM

## 2023-10-27 MED ORDER — CYCLOBENZAPRINE HCL 5 MG PO TABS
5.0000 mg | ORAL_TABLET | Freq: Three times a day (TID) | ORAL | 0 refills | Status: DC | PRN
Start: 2023-10-27 — End: 2024-02-23

## 2023-10-30 MED ORDER — PREDNISONE 20 MG PO TABS
ORAL_TABLET | ORAL | 0 refills | Status: DC
Start: 2023-10-30 — End: 2023-12-21

## 2023-11-12 ENCOUNTER — Other Ambulatory Visit: Payer: Self-pay | Admitting: Primary Care

## 2023-11-12 DIAGNOSIS — R7303 Prediabetes: Secondary | ICD-10-CM

## 2023-12-21 ENCOUNTER — Ambulatory Visit: Admitting: Internal Medicine

## 2023-12-21 ENCOUNTER — Ambulatory Visit: Payer: Self-pay

## 2023-12-21 ENCOUNTER — Encounter: Payer: Self-pay | Admitting: *Deleted

## 2023-12-21 ENCOUNTER — Ambulatory Visit
Admission: EM | Admit: 2023-12-21 | Discharge: 2023-12-21 | Disposition: A | Discharge status: Home / Self Care | Attending: Family Medicine | Admitting: Family Medicine

## 2023-12-21 ENCOUNTER — Encounter: Payer: Self-pay | Admitting: Internal Medicine

## 2023-12-21 VITALS — BP 116/66 | HR 105 | Temp 98.3°F | Ht 69.0 in | Wt 266.0 lb

## 2023-12-21 DIAGNOSIS — J069 Acute upper respiratory infection, unspecified: Secondary | ICD-10-CM | POA: Diagnosis not present

## 2023-12-21 DIAGNOSIS — O10019 Pre-existing essential hypertension complicating pregnancy, unspecified trimester: Secondary | ICD-10-CM | POA: Insufficient documentation

## 2023-12-21 DIAGNOSIS — R109 Unspecified abdominal pain: Secondary | ICD-10-CM | POA: Diagnosis not present

## 2023-12-21 DIAGNOSIS — R110 Nausea: Secondary | ICD-10-CM | POA: Diagnosis not present

## 2023-12-21 DIAGNOSIS — Z6791 Unspecified blood type, Rh negative: Secondary | ICD-10-CM | POA: Insufficient documentation

## 2023-12-21 DIAGNOSIS — O3110X0 Continuing pregnancy after spontaneous abortion of one fetus or more, unspecified trimester, not applicable or unspecified: Secondary | ICD-10-CM | POA: Insufficient documentation

## 2023-12-21 DIAGNOSIS — O09529 Supervision of elderly multigravida, unspecified trimester: Secondary | ICD-10-CM | POA: Insufficient documentation

## 2023-12-21 DIAGNOSIS — O4440 Low lying placenta NOS or without hemorrhage, unspecified trimester: Secondary | ICD-10-CM | POA: Insufficient documentation

## 2023-12-21 DIAGNOSIS — R10811 Right upper quadrant abdominal tenderness: Secondary | ICD-10-CM | POA: Insufficient documentation

## 2023-12-21 DIAGNOSIS — O2630 Retained intrauterine contraceptive device in pregnancy, unspecified trimester: Secondary | ICD-10-CM | POA: Insufficient documentation

## 2023-12-21 HISTORY — DX: Continuing pregnancy after spontaneous abortion of one fetus or more, unspecified trimester, not applicable or unspecified: O31.10X0

## 2023-12-21 HISTORY — DX: Low lying placenta nos or without hemorrhage, unspecified trimester: O44.40

## 2023-12-21 HISTORY — DX: Supervision of elderly multigravida, unspecified trimester: O09.529

## 2023-12-21 LAB — POC COVID19/FLU A&B COMBO
Covid Antigen, POC: NEGATIVE
Influenza A Antigen, POC: NEGATIVE
Influenza B Antigen, POC: NEGATIVE

## 2023-12-21 LAB — POC URINALSYSI DIPSTICK (AUTOMATED)
Bilirubin, UA: NEGATIVE
Blood, UA: NEGATIVE
Glucose, UA: NEGATIVE
Ketones, UA: NEGATIVE
Nitrite, UA: NEGATIVE
Protein, UA: NEGATIVE
Spec Grav, UA: 1.015 (ref 1.010–1.025)
Urobilinogen, UA: 0.2 U/dL
pH, UA: 6.5 (ref 5.0–8.0)

## 2023-12-21 MED ORDER — ONDANSETRON HCL 4 MG PO TABS: 4.0000 mg | ORAL_TABLET | Freq: Three times a day (TID) | ORAL | 0 refills | Status: DC | PRN

## 2023-12-21 MED ORDER — AMOXICILLIN-POT CLAVULANATE 875-125 MG PO TABS: 1.0000 | ORAL_TABLET | Freq: Two times a day (BID) | ORAL | 0 refills | Status: DC

## 2023-12-21 MED ORDER — PROMETHAZINE-DM 6.25-15 MG/5ML PO SYRP
5.0000 mL | ORAL_SOLUTION | Freq: Three times a day (TID) | ORAL | 0 refills | Status: DC | PRN
Start: 2023-12-21 — End: 2024-02-23

## 2023-12-21 MED ORDER — CETIRIZINE HCL 10 MG PO TABS: 10.0000 mg | ORAL_TABLET | Freq: Every day | ORAL | 0 refills | Status: DC | PRN

## 2023-12-21 NOTE — Telephone Encounter (Signed)
 Okay---I will assess her at the visit today

## 2023-12-21 NOTE — Discharge Instructions (Signed)
 Your COVID flu test are negative today.  This does not mean you do not have COVID or the flu however have been present for less than 24 hours and at times will not show up on a point-of-care test.  Can purchase an over-the-counter test if your symptoms persist and retesting in 24 hours.  Treatment is still the same manage symptoms with medications prescribed.  Take Tylenol or Motrin as needed for body aches and fever. Follow-up with primary care return for reevaluation if symptoms persist greater than 7 days.

## 2023-12-21 NOTE — Assessment & Plan Note (Signed)
 It appears this is the real pain spot--and not really the CVA area Urine shows 3+ leuks but negative blood and nitrite--will send culture just in case  Her sig pain with fever, etc--is most consistent with cholecystitis She does look better now and no fever---may have had stone passed and symptoms improving Will treat with augmentin 875 bid x 7 days Discussed need for ER evaluation if any worsening especially fever, chills, severe pain, etc Will set up ultrasound to confirm gallbladder disease and then refer to surgery

## 2023-12-21 NOTE — ED Provider Notes (Signed)
 [facility]    CSN: [id number] Arrival date & time: 12/21/23  0803      History   Chief Complaint Chief Complaint  Patient presents with   Generalized Body Aches   Fever   Cough   Sore Throat    HPI Misty Brown is a 43 y.o. female. Patient presents today with a one day history of viral symptoms including generalized bodyaches, headaches, postnasal drainage causing soreness of throat, and 1 incident of fever yesterday Tmax 100.5.  Unknown of any sick contacts. Took one dose of Tylenol on yesterday which resolved fever.  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   Chronic right-sided low back pain with right-sided sciatica 10/20/2023   Headache 07/22/2023   Chronic joint pain 03/25/2023   Encounter for annual general medical examination with abnormal findings in adult 03/25/2023   Prediabetes 03/25/2023   GERD (gastroesophageal reflux disease) 03/25/2023   Chest pain 02/27/2022   Pruritus of vagina 10/02/2021   Vaginal discharge 10/02/2021   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   Sleep apnea 05/03/2019   Fatigue 05/03/2019   Anxiety and depression 01/08/2018   Essential hypertension 03/23/2017   Pregnancy 08/26/2016   Gestational diabetes mellitus 07/23/2015   Abnormal chromosomal and genetic finding on antenatal screening of mother 02/22/2015   Lower extremity weakness 02/09/2014   Morning sickness 02/09/2014   Hereditary and idiopathic peripheral neuropathy 02/09/2014    Past Surgical History:  Procedure Laterality Date   TUBAL LIGATION N/A  03/23/2017   Procedure: POST PARTUM TUBAL LIGATION;  Surgeon: Essie Hart, MD;  Location: [facility];  Service: Gynecology;  Laterality: N/A;   WISDOM TOOTH EXTRACTION      OB History     Gravida  3   Para  3   Term  3   Preterm      AB      Living  3      SAB      IAB      Ectopic      Multiple  0   Live Births  3            Home Medications    Prior to Admission medications   Medication Sig Start Date End Date Taking? Authorizing Provider  cetirizine (ZYRTEC) 10 MG tablet Take 1 tablet (10 mg total) by mouth daily as needed for allergies or rhinitis. 12/21/23  Yes Bing Neighbors, NP  FLUoxetine (PROZAC) 20 MG capsule TAKE 1 CAPSULE BY MOUTH EVERY DAY FOR ANXIETY 05/06/23  Yes Doreene Nest, NP  hydrochlorothiazide (HYDRODIURIL) 12.5 MG tablet TAKE 1 TABLET (12.5 MG TOTAL) BY MOUTH DAILY FOR BLOOD PRESSURE 08/03/23  Yes Doreene Nest, NP  ondansetron (ZOFRAN) 4 MG tablet Take 1 tablet (4 mg total) by mouth every 8 (eight) hours as needed for nausea or vomiting. 12/21/23  Yes Bing Neighbors, NP  promethazine-dextromethorphan (PROMETHAZINE-DM) 6.25-15 MG/5ML syrup Take 5 mLs by mouth 3 (three) times daily as needed for cough. 12/21/23  Yes Bing Neighbors, NP  vitamin B-12 (CYANOCOBALAMIN) 500 MCG tablet Take 500 mcg by  mouth daily.   Yes [provider]  Vitamin D, Cholecalciferol, 25 MCG (1000 UT) TABS Take by mouth.   Yes [provider]  cyclobenzaprine (FLEXERIL) 5 MG tablet Take 1-2 tablets (5-10 mg total) by mouth 3 (three) times daily as needed for muscle spasms. 10/27/23   Doreene Nest, NP  lansoprazole (PREVACID) 15 MG capsule Take 1 capsule (15 mg total) by mouth daily. For heartburn 05/05/22   Doreene Nest, NP  metFORMIN (GLUCOPHAGE-XR) 500 MG 24 hr tablet TAKE 1 TABLET (500 MG TOTAL) BY MOUTH DAILY WITH BREAKFAST. FOR PREDIABETES 11/12/23   Doreene Nest, NP  predniSONE (DELTASONE) 20 MG tablet Take 3 tablets my  mouth once daily in the morning for 3 days, then 2 tablets for 3 days, then 1 tablet for 3 days. 10/30/23   Doreene Nest, NP    Family History Family History  Problem Relation Age of Onset   Hypertension Mother    Hyperlipidemia Mother    Heart murmur Mother    Heart disease Maternal Grandmother    Heart disease Maternal Grandfather    Stroke Paternal Grandmother     Social History Social History   Tobacco Use   Smoking status: Never   Smokeless tobacco: Never  Vaping Use   Vaping status: Never Used  Substance Use Topics   Alcohol use: No   Drug use: No     Allergies   Dicyclomine and Doxycycline   Review of Systems Review of Systems  Constitutional:  Positive for fever.  Respiratory:  Positive for cough.      Physical Exam Triage Vital Signs ED Triage Vitals  Encounter Vitals Group     BP 12/21/23 0816 121/82     Systolic BP Percentile --      Diastolic BP Percentile --      Pulse Rate 12/21/23 0816 (!) 107     Resp 12/21/23 0816 18     Temp 12/21/23 0816 98 F (36.7 C)     Temp Source 12/21/23 0816 Oral     SpO2 12/21/23 0816 96 %     Weight 12/21/23 0814 230 lb (104.3 kg)     Height 12/21/23 0814 5\' 9"  (1.753 m)     Head Circumference --      Peak Flow --      Pain Score 12/21/23 0811 0     Pain Loc --      Pain Education --      Exclude from Growth Chart --    No data found.  Updated Vital Signs BP 121/82 (BP Location: Left Arm)   Pulse (!) 107   Temp 98 F (36.7 C) (Oral)   Resp 18   Ht 5\' 9"  (1.753 m)   Wt 230 lb (104.3 kg)   LMP  (LMP Unknown)   SpO2 96%   BMI 33.97 kg/m   Visual Acuity Right Eye Distance:   Left Eye Distance:   Bilateral Distance:    Right Eye Near:   Left Eye Near:    Bilateral Near:     Physical Exam General Appearance:    Alert, cooperative, no distress  HENT: Normocephalic, neck without nodes, throat non-erythematous with postnasal drainage present, and nasal mucosa congested  Eyes:    PERRL,  conjunctiva/corneas clear, EOM's intact       Lungs:     Clear to auscultation bilaterally, respirations unlabored  Heart:    Regular rate and rhythm  Neurologic:   Awake, alert,  oriented x 3. No apparent focal neurological           defect.         UC Treatments / Results  Labs (all labs ordered are listed, but only abnormal results are displayed) Labs Reviewed  POC COVID19/FLU A&B COMBO - Normal    EKG   Radiology No results found.  Procedures Procedures (including critical care time)  Medications Ordered in UC Medications - No data to display  Initial Impression / Assessment and Plan / UC Course  I have reviewed the triage vital signs and the nursing notes.  Pertinent labs & imaging results that were available during my care of the patient were reviewed by me and considered in my medical decision making (see chart for details).     Rapid COVID/flu antigen testing here in clinic is negative.  Explained to patient test could have been negative given that symptoms have been present for less than 24 hours.  Claimed that symptoms are likely result of a viral illness.  REcommended purchasing an over-the-counter COVID flu test and repeating in 24 hours.  There would be no change in treatment symptom management is warranted only.  Zofran prescribed for nausea and vomiting, cetirizine for nasal symptoms and postnasal drainage, Promethazine DM for cough and congestion.  Force fluids and rest.  Tylenol or Motrin as needed for headache, body aches and fever.  Follow-up with PCP or return for reevaluation if symptoms do not improve within 7 days. Final Clinical Impressions(s) / UC Diagnoses   Final diagnoses:  Viral upper respiratory illness  Nausea without vomiting     Discharge Instructions      Your COVID flu test are negative today.  This does not mean you do not have COVID or the flu however have been present for less than 24 hours and at times will not show up on a  point-of-care test.  Can purchase an over-the-counter test if your symptoms persist and retesting in 24 hours.  Treatment is still the same manage symptoms with medications prescribed.  Take Tylenol or Motrin as needed for body aches and fever. Follow-up with primary care return for reevaluation if symptoms persist greater than 7 days.     ED Prescriptions     Medication Sig Dispense Auth. Provider   ondansetron (ZOFRAN) 4 MG tablet Take 1 tablet (4 mg total) by mouth every 8 (eight) hours as needed for nausea or vomiting. 20 tablet Bing Neighbors, NP   cetirizine (ZYRTEC) 10 MG tablet Take 1 tablet (10 mg total) by mouth daily as needed for allergies or rhinitis. 20 tablet Bing Neighbors, NP   promethazine-dextromethorphan (PROMETHAZINE-DM) 6.25-15 MG/5ML syrup Take 5 mLs by mouth 3 (three) times daily as needed for cough. 118 mL Bing Neighbors, NP      PDMP not reviewed this encounter.   Bing Neighbors, NP 12/21/23 [phone]

## 2023-12-21 NOTE — Progress Notes (Signed)
 Subjective:    Patient ID: Misty Brown, female    DOB: 08-25-1981, 43 y.o.   MRN: [id number]  HPI Here due to flank pain With mom  Started yesterday--and felt achy all over Thought she might have the flu Specific right flank pain as well  Went to urgent care this morning---COVID/Flu negative Fever last night--but not today Chills and sweats last night Some cough and nasal congestion  Made appointment here since flank pain not specifically addressed No urinary urgency, frequency or dysuria. No blood in urine No history of kidney stones  Tylenol not helping body pain Got zyrtec at urgent care  Pain was worst last night in bed---some better now No change with movement  Does notice some nausea after eating  Current Outpatient Medications on File Prior to Visit  Medication Sig Dispense Refill   cetirizine (ZYRTEC) 10 MG tablet Take 1 tablet (10 mg total) by mouth daily as needed for allergies or rhinitis. 20 tablet 0   cyclobenzaprine (FLEXERIL) 5 MG tablet Take 1-2 tablets (5-10 mg total) by mouth 3 (three) times daily as needed for muscle spasms. 15 tablet 0   FLUoxetine (PROZAC) 20 MG capsule TAKE 1 CAPSULE BY MOUTH EVERY DAY FOR ANXIETY 90 capsule 2   hydrochlorothiazide (HYDRODIURIL) 12.5 MG tablet TAKE 1 TABLET (12.5 MG TOTAL) BY MOUTH DAILY FOR BLOOD PRESSURE 90 tablet 1   lansoprazole (PREVACID) 15 MG capsule Take 1 capsule (15 mg total) by mouth daily. For heartburn 90 capsule 0   metFORMIN (GLUCOPHAGE-XR) 500 MG 24 hr tablet TAKE 1 TABLET (500 MG TOTAL) BY MOUTH DAILY WITH BREAKFAST. FOR PREDIABETES 90 tablet 0   ondansetron (ZOFRAN) 4 MG tablet Take 1 tablet (4 mg total) by mouth every 8 (eight) hours as needed for nausea or vomiting. 20 tablet 0   promethazine-dextromethorphan (PROMETHAZINE-DM) 6.25-15 MG/5ML syrup Take 5 mLs by mouth 3 (three) times daily as needed for cough. 118 mL 0   vitamin B-12 (CYANOCOBALAMIN) 500 MCG tablet Take 500 mcg by mouth daily.      Vitamin D, Cholecalciferol, 25 MCG (1000 UT) TABS Take by mouth.     No current facility-administered medications on file prior to visit.    Allergies  Allergen Reactions   Dicyclomine     Abdominal pain    Doxycycline Nausea And Vomiting    Past Medical History:  Diagnosis Date   Anxiety and depression    Elevated blood pressure reading    Gestational diabetes     Past Surgical History:  Procedure Laterality Date   TUBAL LIGATION N/A 03/23/2017   Procedure: POST PARTUM TUBAL LIGATION;  Surgeon: Essie Hart, MD;  Location: [facility];  Service: Gynecology;  Laterality: N/A;   WISDOM TOOTH EXTRACTION      Family History  Problem Relation Age of Onset   Hypertension Mother    Hyperlipidemia Mother    Heart murmur Mother    Heart disease Maternal Grandmother    Heart disease Maternal Grandfather    Stroke Paternal Grandmother     Social History   Socioeconomic History   Marital status: Married    Spouse name: Not on file   Number of children: Not on file   Years of education: Not on file   Highest education level: Not on file  Occupational History   Not on file  Tobacco Use   Smoking status: Never   Smokeless tobacco: Never  Vaping Use   Vaping status: Never Used  Substance and Sexual Activity  Alcohol use: No   Drug use: No   Sexual activity: Not Currently  Other Topics Concern   Not on file  Social History Narrative   Married.   3 children.   Works in [employer].   Enjoys spending time with her family.   Social Drivers of Corporate investment banker Strain: Not on file  Food Insecurity: Not on file  Transportation Needs: Not on file  Physical Activity: Not on file  Stress: Not on file  Social Connections: Unknown (02/11/2022)   [facility]   Social Network    Social Network: Not on file  Intimate Partner Violence: Unknown (01/03/2022)   Received from [facility]   HITS    Physically Hurt: Not on file     Insult or Talk Down To: Not on file    Threaten Physical Harm: Not on file    Scream or Curse: Not on file   Review of Systems No N/V Did have stomach virus a couple of weeks ago---not completely normal  Hasn't been eating normally since yesterday No recent lifting or new household tasks No periods since ablation 8 years ago    Objective:   Physical Exam Constitutional:      Appearance: Normal appearance.  Cardiovascular:     Rate and Rhythm: Normal rate and regular rhythm.     Heart sounds: No murmur heard.    No gallop.  Pulmonary:     Effort: Pulmonary effort is normal.     Breath sounds: Normal breath sounds. No wheezing or rales.  Abdominal:     General: Bowel sounds are normal.     Palpations: Abdomen is soft.     Comments: Slight LUQ tenderness Significant RUQ tenderness and positive Murphy's sign No suprapubic tenderness Mild right CVA tenderness  Musculoskeletal:     Cervical back: Neck supple.  Lymphadenopathy:     Cervical: No cervical adenopathy.  Neurological:     Mental Status: She is alert.            Assessment & Plan:

## 2023-12-21 NOTE — ED Triage Notes (Signed)
 Here with Mother today. "This started yesterday mid afternoon with ha and stomach pain/aches". "This continued with Fever (100.5) and body aches". "Now this morning everything seems to hurt". No known Fever today. No COVID19 or Flu seasonal vaccines.

## 2023-12-21 NOTE — Addendum Note (Signed)
 Addended by: Eual Fines on: 12/21/2023 12:37 PM   Modules accepted: Orders

## 2023-12-21 NOTE — Telephone Encounter (Signed)
  Chief Complaint: R flank pain Symptoms: pain, fever, body aches, abd pain Frequency: 1 day Pertinent Negatives: Patient denies difficulty urinating, blood in urine Disposition: [] ED /[] Urgent Care (no appt availability in office) / [x] Appointment(In office/virtual)/ []  [facility]/ [] Home Care/ [] Refused Recommended Disposition /[] [facility]/ []  Follow-up with PCP Additional Notes: Patient calls reporting worsening R flank pain that began yesterday evening. Patient states pain has decreased but reports fever yesterday of 100.87F. At this time during call patient is afebrile. Per protocol, patient to be evaluated within 24 hours. First available appointment with PCP outside of guidelines. Patient scheduled with first available provider in clinic for today at 1215. Care advice reviewed, patient verbalized understanding and denies further questions at this time. Alerting PCP for review.    Reason for Disposition  MODERATE pain (e.g., interferes with normal activities or awakens from sleep)  Answer Assessment - Initial Assessment Questions 1. LOCATION: "Where does it hurt?" (e.g., left, right)     Right side 2. ONSET: "When did the pain start?"     Began yesterday evening around 2000 3. SEVERITY: "How bad is the pain?" (e.g., Scale 1-10; mild, moderate, or severe)   - MILD (1-3): doesn't interfere with normal activities    - MODERATE (4-7): interferes with normal activities or awakens from sleep    - SEVERE (8-10): excruciating pain and patient unable to do normal activities (stays in bed)       1/10, has improved since last night 4. PATTERN: "Does the pain come and go, or is it constant?"      Described as throbbing, comes and goes. 5. CAUSE: "What do you think is causing the pain?"     Possible UTI 6. OTHER SYMPTOMS:  "Do you have any other symptoms?" (e.g., fever, abdomen pain, vomiting, leg weakness, burning with urination, blood in urine)     Abdominal pain,  fever 7. PREGNANCY:  "Is there any chance you are pregnant?" "When was your last menstrual period?"     Denies  Protocols used: Flank Pain-A-AH

## 2023-12-22 ENCOUNTER — Encounter: Payer: Self-pay | Admitting: Internal Medicine

## 2023-12-22 ENCOUNTER — Ambulatory Visit
Admission: RE | Admit: 2023-12-22 | Discharge: 2023-12-22 | Discharge status: Home / Self Care | Source: Ambulatory Visit | Attending: Internal Medicine | Admitting: Internal Medicine

## 2023-12-22 DIAGNOSIS — R1011 Right upper quadrant pain: Secondary | ICD-10-CM | POA: Diagnosis not present

## 2023-12-22 DIAGNOSIS — R10811 Right upper quadrant abdominal tenderness: Secondary | ICD-10-CM

## 2023-12-22 LAB — URINE CULTURE
MICRO NUMBER:: 16238795
SPECIMEN QUALITY:: ADEQUATE

## 2023-12-23 ENCOUNTER — Encounter: Payer: Self-pay | Admitting: Internal Medicine

## 2023-12-23 DIAGNOSIS — I10 Essential (primary) hypertension: Secondary | ICD-10-CM

## 2023-12-23 DIAGNOSIS — E6609 Other obesity due to excess calories: Secondary | ICD-10-CM

## 2023-12-23 DIAGNOSIS — R7303 Prediabetes: Secondary | ICD-10-CM

## 2023-12-23 DIAGNOSIS — G4730 Sleep apnea, unspecified: Secondary | ICD-10-CM

## 2023-12-23 DIAGNOSIS — G8929 Other chronic pain: Secondary | ICD-10-CM

## 2023-12-28 ENCOUNTER — Encounter (INDEPENDENT_AMBULATORY_CARE_PROVIDER_SITE_OTHER): Payer: Self-pay

## 2024-01-12 ENCOUNTER — Ambulatory Visit (INDEPENDENT_AMBULATORY_CARE_PROVIDER_SITE_OTHER): Admitting: Family Medicine

## 2024-01-12 ENCOUNTER — Encounter (INDEPENDENT_AMBULATORY_CARE_PROVIDER_SITE_OTHER): Payer: Self-pay | Admitting: Family Medicine

## 2024-01-12 VITALS — BP 129/81 | HR 69 | Temp 97.8°F | Ht 68.0 in | Wt 255.0 lb

## 2024-01-12 DIAGNOSIS — E669 Obesity, unspecified: Secondary | ICD-10-CM | POA: Diagnosis not present

## 2024-01-12 DIAGNOSIS — I10 Essential (primary) hypertension: Secondary | ICD-10-CM

## 2024-01-12 DIAGNOSIS — Z0289 Encounter for other administrative examinations: Secondary | ICD-10-CM

## 2024-01-12 DIAGNOSIS — R7303 Prediabetes: Secondary | ICD-10-CM

## 2024-01-12 DIAGNOSIS — K760 Fatty (change of) liver, not elsewhere classified: Secondary | ICD-10-CM

## 2024-01-12 DIAGNOSIS — Z6838 Body mass index (BMI) 38.0-38.9, adult: Secondary | ICD-10-CM

## 2024-01-12 NOTE — Progress Notes (Signed)
 Office: [phone]  /  Fax: [phone]  WEIGHT SUMMARY AND BIOMETRICS  Anthropometric Measurements Height: 5\' 8"  (1.727 m) Weight: 255 lb (115.7 kg) BMI (Calculated): 38.78 Weight at Last Visit: N/a Weight Lost Since Last Visit: N/a Weight Gained Since Last Visit: N/a Starting Weight: 255lb Total Weight Loss (lbs): 0 lb (0 kg) Peak Weight: N/a Waist Measurement : 48 inches   Body Composition  Body Fat %: 43.7 % Fat Mass (lbs): 111.6 lbs Muscle Mass (lbs): 136.4 lbs Total Body Water (lbs): 94.2 lbs Visceral Fat Rating : 12   Other Clinical Data Fasting: No Labs: no Today's Visit #: Info Session    Chief Complaint: OBESITY   Discussed the use of AI scribe software for clinical note transcription with the patient, who gave verbal consent to proceed.  History of Present Illness Misty Brown is a 43 year old female who presents for evaluation of her weight and potential participation in a weight loss program.  She has experienced weight fluctuations, particularly after her pregnancies, and describes her weight management as a 'yo-yo diet.' She has tried various diets, including fasting and portion control, without lasting success. During her second pregnancy, she experienced gestational diabetes and suspects she may have had it during her third pregnancy, although it was not diagnosed. She managed to maintain a slim figure during her second pregnancy with a special diet.  She describes episodes of feeling weak and shaky, similar to the sensation after running, despite normal eating. These episodes resolve after consuming sweet tea and bananas, but she feels nauseous afterward. These episodes have recurred occasionally.  Recently, she experienced severe pain described as feeling like being 'ran over by a truck.' She visited urgent care, where tests for various conditions were negative. Her mother was concerned about her kidneys, but further evaluation suggested  gallbladder issues. An ultrasound revealed a fatty liver. She occasionally experiences a dull pain that comes and goes, but it does not worsen with deep breaths.  She has made dietary changes, eliminating fast food and soda, and consuming water, green tea, chicken, fresh fruits, and vegetables. Despite these changes, she reports swelling in her feet and ankles, despite being on hydrochlorothiazide for fluid retention. Her ankles have always been small, and she can feel a dent when pressing on them, indicating fluid retention. No pain worsening with deep breaths. She feels tired, hungry, and irritable, similar to pregnancy symptoms, but confirms she is not pregnant.      PHYSICAL EXAM:  Blood pressure 129/81, pulse 69, temperature 97.8 F (36.6 C), height 5\' 8"  (1.727 m), weight 255 lb (115.7 kg), SpO2 98%. Body mass index is 38.77 kg/m.  DIAGNOSTIC DATA REVIEWED:  BMET    Component Value Date/Time   NA 135 03/25/2023 1447   K 4.1 03/25/2023 1447   CL 100 03/25/2023 1447   CO2 27 03/25/2023 1447   GLUCOSE 115 (H) 03/25/2023 1447   BUN 10 03/25/2023 1447   CREATININE 0.68 03/25/2023 1447   CALCIUM 9.3 03/25/2023 1447   GFRNONAA >60 03/26/2011 0533   GFRAA >60 03/26/2011 0533   Lab Results  Component Value Date   HGBA1C 6.0 (A) 07/22/2023   HGBA1C 5.7 02/09/2014   No results found for: "INSULIN" Lab Results  Component Value Date   TSH 1.05 02/27/2022   CBC    Component Value Date/Time   WBC 6.7 02/27/2022 1459   RBC 4.95 02/27/2022 1459   HGB 12.8 02/27/2022 1459   HCT 38.5 02/27/2022 1459   PLT 234.0  02/27/2022 1459   MCV 77.8 (L) 02/27/2022 1459   MCH 24.4 (L) 03/24/2017 0524   MCHC 33.3 02/27/2022 1459   RDW 13.1 02/27/2022 1459   Iron Studies No results found for: "IRON", "TIBC", "FERRITIN", "IRONPCTSAT" Lipid Panel     Component Value Date/Time   CHOL 133 03/25/2023 1447   TRIG 139.0 03/25/2023 1447   HDL 37.00 (L) 03/25/2023 1447   CHOLHDL 4 03/25/2023  1447   VLDL 27.8 03/25/2023 1447   LDLCALC 68 03/25/2023 1447   Hepatic Function Panel     Component Value Date/Time   PROT 7.0 03/25/2023 1447   ALBUMIN 4.2 03/25/2023 1447   AST 33 03/25/2023 1447   ALT 39 (H) 03/25/2023 1447   ALKPHOS 61 03/25/2023 1447   BILITOT 0.3 03/25/2023 1447      Component Value Date/Time   TSH 1.05 02/27/2022 1459   Nutritional Lab Results  Component Value Date   VD25OH 35.50 07/26/2019   VD25OH 19.91 (L) 05/03/2019   VD25OH 30 02/09/2014     Assessment and Plan Assessment & Plan Obesity Obesity with a BMI of 38.8, fat percentage of 43.7, and visceral fat rating of 12. Comorbidities include gestational diabetes and prediabetes. She experiences challenges with weight management, including yo-yo dieting and excessive hunger signals. Genetic factors may contribute to her obesity, and her body may resist weight loss efforts. A personalized approach is necessary, involving a comprehensive workup to identify metabolic and nutritional needs, with frequent follow-ups to adjust the plan. Half of the patients do not require additional medication, but options will be discussed if necessary.  - Enroll in the weight loss program - Complete new patient paperwork and bring it to the next appointment - Schedule an appointment for new patient testing and workup - Discuss potential medication options if needed - Develop a personalized eating plan - Discuss exercise as part of a lifelong plan  Fatty Liver Disease Diagnosed with fatty liver disease, common in patients with obesity and blood sugar fluctuations such as gestational diabetes. Reports intermittent dull pain in the liver area, possibly related to gallbladder issues or colicky pain. Fatty liver disease occurs when fat accumulates in the liver, causing discomfort and potential complications. Dietary modifications are expected to improve the condition. - Monitor liver function and symptoms - Continue dietary  modifications to reduce fat intake  Hypertension Currently on hydrochlorothiazide for hypertension but reports persistent fluid retention and ankle swelling. The current dose might be insufficient, and certain foods could be contributing to fluid retention. A thorough evaluation of medication dosage and dietary factors is necessary. - Evaluate current medication dosage and effectiveness - Assess dietary factors contributing to fluid retention  Prediabetes Emphasized the importance of preventing diabetes and heart disease through lifestyle modifications and regular monitoring. She has made significant dietary changes, including reducing soda intake and practicing portion control, resulting in weight loss. The program aims to prevent diabetes and heart disease by maintaining these lifestyle changes. - Continue dietary modifications and portion control - Monitor blood pressure and blood sugar levels regularly     I have personally spent 41 minutes total time today in direct patient care, reviewing labs and history in Epic and documentation of this visit.  She was informed of the importance of frequent follow up visits to maximize her success with intensive lifestyle modifications for her multiple health conditions.    Quillian Quince, MD

## 2024-01-23 ENCOUNTER — Other Ambulatory Visit: Payer: Self-pay | Admitting: Primary Care

## 2024-01-23 DIAGNOSIS — I10 Essential (primary) hypertension: Secondary | ICD-10-CM

## 2024-02-17 ENCOUNTER — Encounter (INDEPENDENT_AMBULATORY_CARE_PROVIDER_SITE_OTHER): Payer: Self-pay | Admitting: Family Medicine

## 2024-02-23 ENCOUNTER — Encounter (INDEPENDENT_AMBULATORY_CARE_PROVIDER_SITE_OTHER): Payer: Self-pay | Admitting: Family Medicine

## 2024-02-23 ENCOUNTER — Ambulatory Visit (INDEPENDENT_AMBULATORY_CARE_PROVIDER_SITE_OTHER): Admitting: Family Medicine

## 2024-02-23 VITALS — BP 126/81 | HR 69 | Temp 98.4°F | Ht 68.0 in | Wt 251.0 lb

## 2024-02-23 DIAGNOSIS — R0602 Shortness of breath: Secondary | ICD-10-CM | POA: Diagnosis not present

## 2024-02-23 DIAGNOSIS — K760 Fatty (change of) liver, not elsewhere classified: Secondary | ICD-10-CM | POA: Diagnosis not present

## 2024-02-23 DIAGNOSIS — R5383 Other fatigue: Secondary | ICD-10-CM | POA: Diagnosis not present

## 2024-02-23 DIAGNOSIS — E669 Obesity, unspecified: Secondary | ICD-10-CM | POA: Diagnosis not present

## 2024-02-23 DIAGNOSIS — Z6838 Body mass index (BMI) 38.0-38.9, adult: Secondary | ICD-10-CM

## 2024-02-23 DIAGNOSIS — I10 Essential (primary) hypertension: Secondary | ICD-10-CM | POA: Diagnosis not present

## 2024-02-23 DIAGNOSIS — R7303 Prediabetes: Secondary | ICD-10-CM | POA: Diagnosis not present

## 2024-02-23 DIAGNOSIS — Z1331 Encounter for screening for depression: Secondary | ICD-10-CM | POA: Diagnosis not present

## 2024-02-23 NOTE — Progress Notes (Signed)
 Office: [phone]  /  Fax: [phone]  WEIGHT SUMMARY AND BIOMETRICS  Anthropometric Measurements Height: 5\' 8"  (1.727 m) Weight: 251 lb (113.9 kg) BMI (Calculated): 38.17 Starting Weight: 251 lb Peak Weight: 264 lb Waist Measurement : 47 inches   Body Composition  Body Fat %: 43.9 % Fat Mass (lbs): 110.4 lbs Muscle Mass (lbs): 134 lbs Total Body Water (lbs): 93 lbs Visceral Fat Rating : 11   Other Clinical Data RMR: 2002 Fasting: yes Labs: yes Today's Visit #: 1 Starting Date: 02/23/24 Comments: 1st Visit    Chief Complaint: OBESITY    History of Present Illness Misty Brown is a 43 year old female with obesity who presents for evaluation and management of obesity and related comorbidities.  She has experienced significant weight gain, reaching her heaviest at 264 pounds. Previous weight loss attempts included programs like Weight Watchers, which were somewhat successful after her third child, but she eventually reverted to previous habits. She identifies bread as a significant dietary challenge and acknowledges emotional eating behaviors. Despite making dietary changes, such as consuming fresh fruits and vegetables and reducing red meat intake, she experiences persistent hunger shortly after meals.  She has a history of hypertension and experiences fatigue that worsens with weight gain. Shortness of breath exacerbates with exercise and weight gain. She has a history of prediabetes and was previously on metformin , which was discontinued due to gastrointestinal issues. She is not currently on any medication for prediabetes.  An ultrasound revealed fatty liver, initially suspected to be gallstones. She has a history of heartburn, previously managed with Prevacid , but reports no current issues after dietary changes, including drinking Lipton green tea and water instead of soft drinks. She notes that soft drinks exacerbate joint inflammation, particularly in her  hip.  She is not currently on allergy medication but anticipates potential issues in the fall. She is not engaging in a structured exercise program but reports being active, especially during a recent beach trip where she walked extensively. She is mindful of her activity levels and rest, balancing movement with periods of rest when fatigued.  Testing today includes:  Basal Metabolic Rate calculated via Bioimpedance scale 1981 Resting Energy Expenditure measured via Indirect Calorimeter 2002 and is slightly better than expected. Modified PHQ-9 Mood and Food score was elevated at 11 Epworth Sleepiness Score was WNL at 1       PHYSICAL EXAM:  Blood pressure 126/81, pulse 69, temperature 98.4 F (36.9 C), height 5\' 8"  (1.727 m), weight 251 lb (113.9 kg), SpO2 100%. Body mass index is 38.16 kg/m.  DIAGNOSTIC DATA REVIEWED:  BMET    Component Value Date/Time   NA 135 03/25/2023 1447   K 4.1 03/25/2023 1447   CL 100 03/25/2023 1447   CO2 27 03/25/2023 1447   GLUCOSE 115 (H) 03/25/2023 1447   BUN 10 03/25/2023 1447   CREATININE 0.68 03/25/2023 1447   CALCIUM 9.3 03/25/2023 1447   GFRNONAA >60 03/26/2011 0533   GFRAA >60 03/26/2011 0533   Lab Results  Component Value Date   HGBA1C 6.0 (A) 07/22/2023   HGBA1C 5.7 02/09/2014   No results found for: "INSULIN" Lab Results  Component Value Date   TSH 1.05 02/27/2022   CBC    Component Value Date/Time   WBC 6.7 02/27/2022 1459   RBC 4.95 02/27/2022 1459   HGB 12.8 02/27/2022 1459   HCT 38.5 02/27/2022 1459   PLT 234.0 02/27/2022 1459   MCV 77.8 (L) 02/27/2022 1459   MCH  24.4 (L) 03/24/2017 0524   MCHC 33.3 02/27/2022 1459   RDW 13.1 02/27/2022 1459   Iron Studies No results found for: "IRON", "TIBC", "FERRITIN", "IRONPCTSAT" Lipid Panel     Component Value Date/Time   CHOL 133 03/25/2023 1447   TRIG 139.0 03/25/2023 1447   HDL 37.00 (L) 03/25/2023 1447   CHOLHDL 4 03/25/2023 1447   VLDL 27.8 03/25/2023 1447    LDLCALC 68 03/25/2023 1447   Hepatic Function Panel     Component Value Date/Time   PROT 7.0 03/25/2023 1447   ALBUMIN 4.2 03/25/2023 1447   AST 33 03/25/2023 1447   ALT 39 (H) 03/25/2023 1447   ALKPHOS 61 03/25/2023 1447   BILITOT 0.3 03/25/2023 1447      Component Value Date/Time   TSH 1.05 02/27/2022 1459   Nutritional Lab Results  Component Value Date   VD25OH 35.50 07/26/2019   VD25OH 19.91 (L) 05/03/2019   VD25OH 30 02/09/2014     Assessment and Plan Assessment & Plan Obesity Obesity with a current weight of 264 lbs. Previous weight loss attempts include Weight Watchers. Challenges include emotional eating and preference for foods like bread and pasta. No current exercise regimen, but light activity is encouraged. Emphasis on dietary changes as primary intervention. - Initiate category three eating plan focusing on portion control and balanced nutrition. - Educate on using a food scale for protein measurement. - Encourage tracking of daily food intake to ensure all portions are consumed. - Advise against weighing self at home to avoid fixation on numbers. - Follow-up in two weeks to review progress and adjust plan as needed.  Fatigue related to weight gain Fatigue worsening with weight gain. Addressed through weight management strategies.  Shortness of breath related to weight gain Shortness of breath exacerbated by exercise and weight gain. Addressed through weight management strategies.  Prediabetes Prediabetes with previous intolerance to metformin  due to gastrointestinal side effects. Current focus on dietary management to improve glycemic control. Weight loss is expected to improve prediabetes before reaching ultimate weight loss goals. - Continue dietary management with category three eating plan. - Follow-up in two weeks to review progress and adjust plan as needed.  MASLD (Metabolic Associated Steatotic Liver Disease) Ultrasound showed fatty liver. No  gallstones present. Emphasis on weight loss as primary treatment to reduce liver fat and prevent progression to cirrhosis. Discussed that 10% of patients with fatty liver may progress to liver scarring, and 10% of those may progress to liver failure. Weight loss is the only treatment to reverse fatty liver. - Continue dietary management with category three eating plan to promote weight loss. - Follow-up in two weeks to review progress and adjust plan as needed.  Hypertension Hypertension is well-controlled. Goal to improve with diet/exercise/weight loss -Start eating plan and recheck in 2 weeks     I have personally spent 42 minutes total time today in preparation, patient care, and documentation for this visit, including the following: review of clinical lab tests; review of medical history, review of   She was informed of the importance of frequent follow up visits to maximize her success with intensive lifestyle modifications for her multiple health conditions.    Jasmine Mesi, MD

## 2024-02-24 LAB — HEMOGLOBIN A1C
Est. average glucose Bld gHb Est-mCnc: 117 mg/dL
Hgb A1c MFr Bld: 5.7 % — ABNORMAL HIGH (ref 4.8–5.6)

## 2024-02-24 LAB — CBC WITH DIFFERENTIAL/PLATELET
Basophils Absolute: 0.1 10*3/uL (ref 0.0–0.2)
Basos: 1 %
EOS (ABSOLUTE): 0.3 10*3/uL (ref 0.0–0.4)
Eos: 4 %
Hematocrit: 40.1 % (ref 34.0–46.6)
Hemoglobin: 13 g/dL (ref 11.1–15.9)
Immature Grans (Abs): 0 10*3/uL (ref 0.0–0.1)
Immature Granulocytes: 0 %
Lymphocytes Absolute: 1.3 10*3/uL (ref 0.7–3.1)
Lymphs: 18 %
MCH: 26.8 pg (ref 26.6–33.0)
MCHC: 32.4 g/dL (ref 31.5–35.7)
MCV: 83 fL (ref 79–97)
Monocytes Absolute: 0.5 10*3/uL (ref 0.1–0.9)
Monocytes: 7 %
Neutrophils Absolute: 5 10*3/uL (ref 1.4–7.0)
Neutrophils: 70 %
Platelets: 289 10*3/uL (ref 150–450)
RBC: 4.85 x10E6/uL (ref 3.77–5.28)
RDW: 13.1 % (ref 11.7–15.4)
WBC: 7.3 10*3/uL (ref 3.4–10.8)

## 2024-02-24 LAB — LIPID PANEL
Chol/HDL Ratio: 4 ratio (ref 0.0–4.4)
Cholesterol, Total: 133 mg/dL (ref 100–199)
HDL: 33 mg/dL — ABNORMAL LOW (ref >39)
LDL Chol Calc (NIH): 80 mg/dL (ref 0–99)
Triglycerides: 108 mg/dL (ref 0–149)
VLDL Cholesterol Cal: 20 mg/dL (ref 5–40)

## 2024-02-24 LAB — T4, FREE: Free T4: 1.06 ng/dL (ref 0.82–1.77)

## 2024-02-24 LAB — COMPREHENSIVE METABOLIC PANEL WITH GFR
ALT: 23 IU/L (ref 0–32)
AST: 22 IU/L (ref 0–40)
Albumin: 4.2 g/dL (ref 3.9–4.9)
Alkaline Phosphatase: 70 IU/L (ref 44–121)
BUN/Creatinine Ratio: 13 (ref 9–23)
BUN: 8 mg/dL (ref 6–24)
Bilirubin Total: 0.3 mg/dL (ref 0.0–1.2)
CO2: 19 mmol/L — ABNORMAL LOW (ref 20–29)
Calcium: 9.3 mg/dL (ref 8.7–10.2)
Chloride: 101 mmol/L (ref 96–106)
Creatinine, Ser: 0.62 mg/dL (ref 0.57–1.00)
Globulin, Total: 2.9 g/dL (ref 1.5–4.5)
Glucose: 91 mg/dL (ref 70–99)
Potassium: 4.1 mmol/L (ref 3.5–5.2)
Sodium: 138 mmol/L (ref 134–144)
Total Protein: 7.1 g/dL (ref 6.0–8.5)
eGFR: 114 mL/min/{1.73_m2} (ref >59)

## 2024-02-24 LAB — VITAMIN B12: Vitamin B-12: 536 pg/mL (ref 232–1245)

## 2024-02-24 LAB — TSH: TSH: 1.09 u[IU]/mL (ref 0.450–4.500)

## 2024-02-24 LAB — T3: T3, Total: 149 ng/dL (ref 71–180)

## 2024-02-24 LAB — FOLATE: Folate: 16.3 ng/mL (ref >3.0)

## 2024-02-24 LAB — VITAMIN D 25 HYDROXY (VIT D DEFICIENCY, FRACTURES): Vit D, 25-Hydroxy: 26 ng/mL — ABNORMAL LOW (ref 30.0–100.0)

## 2024-02-24 LAB — INSULIN, RANDOM: INSULIN: 15.7 u[IU]/mL (ref 2.6–24.9)

## 2024-03-14 ENCOUNTER — Encounter (INDEPENDENT_AMBULATORY_CARE_PROVIDER_SITE_OTHER): Payer: Self-pay | Admitting: Family Medicine

## 2024-03-14 ENCOUNTER — Ambulatory Visit (INDEPENDENT_AMBULATORY_CARE_PROVIDER_SITE_OTHER): Admitting: Family Medicine

## 2024-03-14 VITALS — BP 116/78 | HR 79 | Temp 98.3°F | Ht 68.0 in | Wt 242.0 lb

## 2024-03-14 DIAGNOSIS — E669 Obesity, unspecified: Secondary | ICD-10-CM

## 2024-03-14 DIAGNOSIS — Z6836 Body mass index (BMI) 36.0-36.9, adult: Secondary | ICD-10-CM | POA: Diagnosis not present

## 2024-03-14 DIAGNOSIS — R7303 Prediabetes: Secondary | ICD-10-CM

## 2024-03-14 DIAGNOSIS — E559 Vitamin D deficiency, unspecified: Secondary | ICD-10-CM

## 2024-03-14 MED ORDER — VITAMIN D (ERGOCALCIFEROL) 1.25 MG (50000 UNIT) PO CAPS: 50000.0000 [IU] | ORAL_CAPSULE | ORAL | 0 refills | Status: DC

## 2024-03-14 MED ORDER — METFORMIN HCL 500 MG PO TABS: 500.0000 mg | ORAL_TABLET | Freq: Every day | ORAL | 0 refills | Status: DC

## 2024-03-14 NOTE — Progress Notes (Signed)
 Office: [phone]  /  Fax: [phone]  WEIGHT SUMMARY AND BIOMETRICS  Anthropometric Measurements Height: 5' 8 (1.727 m) Weight: 242 lb (109.8 kg) BMI (Calculated): 36.8 Weight at Last Visit: 251 lb Weight Lost Since Last Visit: 9 lb Weight Gained Since Last Visit: 0 Starting Weight: 251 lb Total Weight Loss (lbs): 9 lb (4.082 kg) Peak Weight: 264 lb   Body Composition  Body Fat %: 37.9 % Fat Mass (lbs): 91.8 lbs Muscle Mass (lbs): 142.6 lbs Total Body Water (lbs): 100 lbs Visceral Fat Rating : 9   Other Clinical Data RMR: 2002 Fasting: no Labs: no Today's Visit #: 2 Starting Date: 02/23/24    Chief Complaint: OBESITY   History of Present Illness Misty Brown is a 43 year old female who presents for a follow-up on her obesity treatment plan.  She has been adhering to a category three eating plan 95% of the time and has increased her physical activity by doing yard work for one to two hours most days of the week, resulting in a weight loss of nine pounds over the last three weeks. She finds the eating plan challenging, especially at suppertime, and experiences increased hunger in the evenings. To manage hunger, she uses 100-calorie popcorn, small Kind bars, and sometimes coffee when she cannot eat more. An incident where her husband bought the wrong salad led to increased hunger after eating around the undesired ingredients.  Her recent lab results show a vitamin D  level of 26, indicating a deficiency. She acknowledges not getting enough sunlight exposure. She has a history of prediabetes with a hemoglobin A1c of 5.7. Her fasting insulin  level was 15.7. She has been previously prescribed metformin  but did not find it effective on its own.  She has three children and has had her tubes tied and an endometrial ablation for birth control. No family history of thyroid  issues was mentioned. No significant findings from the review of systems were noted.       PHYSICAL EXAM:  Blood pressure 116/78, pulse 79, temperature 98.3 F (36.8 C), height 5' 8 (1.727 m), weight 242 lb (109.8 kg), SpO2 95%. Body mass index is 36.8 kg/m.  DIAGNOSTIC DATA REVIEWED:  BMET    Component Value Date/Time   NA 138 02/23/2024 1048   K 4.1 02/23/2024 1048   CL 101 02/23/2024 1048   CO2 19 (L) 02/23/2024 1048   GLUCOSE 91 02/23/2024 1048   GLUCOSE 115 (H) 03/25/2023 1447   BUN 8 02/23/2024 1048   CREATININE 0.62 02/23/2024 1048   CALCIUM 9.3 02/23/2024 1048   GFRNONAA >60 03/26/2011 0533   GFRAA >60 03/26/2011 0533   Lab Results  Component Value Date   HGBA1C 5.7 (H) 02/23/2024   HGBA1C 5.7 02/09/2014   Lab Results  Component Value Date   INSULIN  15.7 02/23/2024   Lab Results  Component Value Date   TSH 1.090 02/23/2024   CBC    Component Value Date/Time   WBC 7.3 02/23/2024 1048   WBC 6.7 02/27/2022 1459   RBC 4.85 02/23/2024 1048   RBC 4.95 02/27/2022 1459   HGB 13.0 02/23/2024 1048   HCT 40.1 02/23/2024 1048   PLT 289 02/23/2024 1048   MCV 83 02/23/2024 1048   MCH 26.8 02/23/2024 1048   MCH 24.4 (L) 03/24/2017 0524   MCHC 32.4 02/23/2024 1048   MCHC 33.3 02/27/2022 1459   RDW 13.1 02/23/2024 1048   Iron Studies No results found for: IRON, TIBC, FERRITIN, IRONPCTSAT Lipid Panel  Component Value Date/Time   CHOL 133 02/23/2024 1048   TRIG 108 02/23/2024 1048   HDL 33 (L) 02/23/2024 1048   CHOLHDL 4.0 02/23/2024 1048   CHOLHDL 4 03/25/2023 1447   VLDL 27.8 03/25/2023 1447   LDLCALC 80 02/23/2024 1048   Hepatic Function Panel     Component Value Date/Time   PROT 7.1 02/23/2024 1048   ALBUMIN 4.2 02/23/2024 1048   AST 22 02/23/2024 1048   ALT 23 02/23/2024 1048   ALKPHOS 70 02/23/2024 1048   BILITOT 0.3 02/23/2024 1048      Component Value Date/Time   TSH 1.090 02/23/2024 1048   Nutritional Lab Results  Component Value Date   VD25OH 26.0 (L) 02/23/2024   VD25OH 35.50 07/26/2019   VD25OH 19.91 (L)  05/03/2019     Assessment and Plan Assessment & Plan Obesity She is adhering to a category three eating plan 95% of the time and has lost nine pounds in the last three weeks. She engages in physical activity, such as yard work, for one to two hours most days. She reports hunger challenges, particularly post-lunch and supper, and feelings of deprivation. Emphasized protein intake to manage hunger and weight loss. Discussed insulin 's role in weight management and metformin 's potential benefits in improving insulin  sensitivity and aiding weight loss. Metformin  is expected to enhance insulin  efficacy, reduce hunger, and aid weight loss. Informed about metformin 's potential side effects, including queasiness if taken on an empty stomach and diarrhea with high carbohydrate meals. Discussed improved fertility as a potential side effect, though she has had a tubal ligation and endometrial ablation. - Continue category three eating plan for breakfast, lunch, and snacks. - Journal dinner with a 400 to 600 calorie goal and a minimum of 40 grams of protein. - Explore more dinner options to increase satisfaction and sustainability of the eating plan. - Start metformin  with the first meal of the day. - Monitor for side effects such as queasiness and diarrhea.  Prediabetes Her hemoglobin A1c is 5.7, indicating prediabetes. Fasting insulin  level is 15.7, suggesting insulin  resistance. Elevated insulin  contributes to weight gain and increased hunger. Discussed insulin 's role in weight management and metformin 's benefits in improving insulin  sensitivity and aiding weight loss. Metformin  is expected to enhance insulin  efficacy, reduce hunger, and aid weight loss. Informed about metformin 's potential side effects, including queasiness if taken on an empty stomach and diarrhea with high carbohydrate meals. Discussed improved fertility as a potential side effect, though she has had a tubal ligation and endometrial  ablation. - Start metformin  with the first meal of the day. - Monitor for side effects such as queasiness and diarrhea.  Vitamin D  Deficiency Her vitamin D  level is 26, below the recommended level of 50, increasing the risk for osteoporosis and contributing to tiredness and sluggishness. Explained the importance of vitamin D  and the need for supplementation. Vitamin D  supplementation is expected to reduce osteoporosis risk and improve energy levels. - Prescribe vitamin D  supplement to be taken once a week for at least three months. - Recheck vitamin D  levels in three months.  General Health Maintenance Discussed the importance of regular thyroid  checks due to family history of thyroid  issues, despite normal current thyroid  function tests. - Check thyroid  function every couple of years.  Follow-up Monitor progress with eating plan, vitamin D  supplementation, and metformin  treatment. - Schedule follow-up appointments every two to three weeks. - If unavailable, see any of the partners for the follow-up visit.     I have  personally spent 48 minutes total time today in preparation, patient care, and documentation for this visit, including the following: review of clinical lab tests; review of medical history, and nutritional counseling for obesity and for glucose control.   She was informed of the importance of frequent follow up visits to maximize her success with intensive lifestyle modifications for her multiple health conditions.    Jasmine Mesi, MD

## 2024-03-29 ENCOUNTER — Encounter: Payer: BC Managed Care – PPO | Admitting: Primary Care

## 2024-03-30 ENCOUNTER — Encounter (INDEPENDENT_AMBULATORY_CARE_PROVIDER_SITE_OTHER): Payer: Self-pay | Admitting: Adult Health

## 2024-03-30 ENCOUNTER — Ambulatory Visit (INDEPENDENT_AMBULATORY_CARE_PROVIDER_SITE_OTHER): Admitting: Adult Health

## 2024-03-30 VITALS — BP 126/85 | HR 80 | Temp 98.2°F | Ht 68.0 in | Wt 240.0 lb

## 2024-03-30 DIAGNOSIS — R7303 Prediabetes: Secondary | ICD-10-CM

## 2024-03-30 DIAGNOSIS — E66812 Obesity, class 2: Secondary | ICD-10-CM | POA: Diagnosis not present

## 2024-03-30 DIAGNOSIS — K760 Fatty (change of) liver, not elsewhere classified: Secondary | ICD-10-CM | POA: Diagnosis not present

## 2024-03-30 DIAGNOSIS — E559 Vitamin D deficiency, unspecified: Secondary | ICD-10-CM

## 2024-03-30 DIAGNOSIS — Z6836 Body mass index (BMI) 36.0-36.9, adult: Secondary | ICD-10-CM

## 2024-03-30 MED ORDER — METFORMIN HCL 500 MG PO TABS: ORAL_TABLET | ORAL | 0 refills | Status: DC

## 2024-03-30 MED ORDER — METFORMIN HCL 500 MG PO TABS: 500.0000 mg | ORAL_TABLET | Freq: Every day | ORAL | 0 refills | Status: DC

## 2024-03-30 MED ORDER — VITAMIN D (ERGOCALCIFEROL) 1.25 MG (50000 UNIT) PO CAPS: 50000.0000 [IU] | ORAL_CAPSULE | ORAL | 0 refills | Status: DC

## 2024-03-30 NOTE — Progress Notes (Signed)
 WEIGHT SUMMARY AND BIOMETRICS  No data recorded No data recorded No data recorded No data recorded  Chief Complaint:   OBESITY Misty Brown is here to discuss her progress with her obesity treatment plan.  She is on the the Category 3 Plan and states she is following her eating plan approximately 99 % of the time.  She states she is exercising Walking/Yardwork 30-45 minutes 7 times per week.  Interim History:  March 2025- brief MyChart discussion with PCP about [facility] therapy Georjean note covered by her insurance.  She was recently started on daily Metformin  500mg - she has been taking with breakfast. She denies GI upset She endorses breakthrough polyphagia after dinner many night per week- recommend to take Metformin  500mg  with dinner. She will often have a cup of coffee after dinner to counteract breakthrough polyphagia  Hydration-she estimates to drink at least 90 oz water/day  She is married with with daughters, ages 50,8,7  Subjective:   1. Vit D Def  Latest Reference Range & Units 07/26/19 15:21 02/23/24 10:48  Vitamin D , 25-Hydroxy 30.0 - 100.0 ng/mL  26.0 (L)  VITD 30.00 - 100.00 ng/mL 35.50   (L): Data is abnormally low  She is on weekly Ergocalciferol - denies N/V/Muscle Weakness  2. Prediabetes Lab Results  Component Value Date   HGBA1C 5.7 (H) 02/23/2024   HGBA1C 6.0 (A) 07/22/2023   HGBA1C 6.0 03/25/2023     Latest Reference Range & Units 02/23/24 10:48  eGFR >59 mL/min/1.73 114   03/14/2024 she was started on Metformin  She has been taking with breakfast. She denies GI upset She endorses breakthrough polyphagia after dinner many night per week- recommend to take Metformin  500mg  with dinner.  Of note- Tubal ligation and endometrial ablation.   3. Metabolic dysfunction-associated steatotic liver disease (MASLD) Procedure Component Value Ref Range Date/Time  US  ABDOMEN LIMITED RUQ (LIVER/GB) [[id number]] Resulted: 12/22/23 1208  Order Status:  Completed Updated: 12/22/23 1211  Narrative:    CLINICAL DATA:  Right upper quadrant pain.  EXAM: ULTRASOUND ABDOMEN LIMITED RIGHT UPPER QUADRANT  COMPARISON:  CT abdomen pelvis 06/18/2017  FINDINGS: Gallbladder:  No gallstones or wall thickening visualized. No sonographic Murphy sign noted by sonographer.  Common bile duct:  Diameter: 3.1 mm  Liver:  Increased echogenicity. No focal lesion. Portal vein is patent on color Doppler imaging with normal direction of blood flow towards the liver.  Other: None.  IMPRESSION: 1. No cholelithiasis or sonographic evidence for acute cholecystitis. 2. Increased hepatic parenchymal echogenicity suggestive of steatosis.      Assessment/Plan:   1. Vit D Def Refill Vitamin D , Ergocalciferol , (DRISDOL ) 1.25 MG (50000 UNIT) CAPS capsule Take 1 capsule (50,000 Units total) by mouth every 7 (seven) days. Dispense: 5 capsule, Refills: 0 ordered   2. Prediabetes Refill  metFORMIN  (GLUCOPHAGE ) 500 MG tablet Take 1 tablet (500 mg total) by mouth daily with DINNER Dispense: 30 tablet, Refills: 0 ordered   3. Metabolic dysfunction-associated steatotic liver disease (MASLD) Continue with weight loss efforts Avoid Nephrotoxic substances  4. BMI 36.0-36.9,adult, CURRENT BMI 36.5  Misty Brown is currently in the action stage of change. As such, her goal is to continue with weight loss efforts. She has agreed to the Category 3 Plan.   Dinner Parameters: 450-600 cal with at least 40g protein  Handouts: Multiple recipes  Exercise goals: All adults should avoid inactivity. Some physical activity is better than none, and adults who participate in any amount of physical activity gain some health benefits. Adults should  also include muscle-strengthening activities that involve all major muscle groups on 2 or more days a week.  Behavioral modification strategies: increasing lean protein intake, decreasing simple carbohydrates, increasing  vegetables, increasing water intake, no skipping meals, meal planning and cooking strategies, keeping healthy foods in the home, and planning for success.  Misty Brown has agreed to follow-up with our clinic in 4 weeks. She was informed of the importance of frequent follow-up visits to maximize her success with intensive lifestyle modifications for her multiple health conditions.   Objective:   There were no vitals taken for this visit. There is no height or weight on file to calculate BMI.  General: Cooperative, alert, well developed, in no acute distress. HEENT: Conjunctivae and lids unremarkable. Cardiovascular: Regular rhythm.  Lungs: Normal work of breathing. Neurologic: No focal deficits.   Lab Results  Component Value Date   CREATININE 0.62 02/23/2024   BUN 8 02/23/2024   NA 138 02/23/2024   K 4.1 02/23/2024   CL 101 02/23/2024   CO2 19 (L) 02/23/2024   Lab Results  Component Value Date   ALT 23 02/23/2024   AST 22 02/23/2024   ALKPHOS 70 02/23/2024   BILITOT 0.3 02/23/2024   Lab Results  Component Value Date   HGBA1C 5.7 (H) 02/23/2024   HGBA1C 6.0 (A) 07/22/2023   HGBA1C 6.0 03/25/2023   HGBA1C 5.9 02/27/2022   HGBA1C 5.6 05/03/2019   Lab Results  Component Value Date   INSULIN  15.7 02/23/2024   Lab Results  Component Value Date   TSH 1.090 02/23/2024   Lab Results  Component Value Date   CHOL 133 02/23/2024   HDL 33 (L) 02/23/2024   LDLCALC 80 02/23/2024   TRIG 108 02/23/2024   CHOLHDL 4.0 02/23/2024   Lab Results  Component Value Date   VD25OH 26.0 (L) 02/23/2024   VD25OH 35.50 07/26/2019   VD25OH 19.91 (L) 05/03/2019   Lab Results  Component Value Date   WBC 7.3 02/23/2024   HGB 13.0 02/23/2024   HCT 40.1 02/23/2024   MCV 83 02/23/2024   PLT 289 02/23/2024   No results found for: IRON, TIBC, FERRITIN  Attestation Statements:   Reviewed by clinician on day of visit: allergies, medications, problem list, medical history, surgical  history, family history, social history, and previous encounter notes.  I have reviewed the above documentation for accuracy and completeness, and I agree with the above. -  Saron Vanorman d. Harol Shabazz, NP-C

## 2024-04-25 ENCOUNTER — Encounter (INDEPENDENT_AMBULATORY_CARE_PROVIDER_SITE_OTHER): Payer: Self-pay | Admitting: Family Medicine

## 2024-04-25 ENCOUNTER — Ambulatory Visit (INDEPENDENT_AMBULATORY_CARE_PROVIDER_SITE_OTHER): Admitting: Family Medicine

## 2024-04-25 VITALS — BP 126/84 | HR 81 | Temp 97.9°F | Ht 68.0 in | Wt 232.0 lb

## 2024-04-25 DIAGNOSIS — E669 Obesity, unspecified: Secondary | ICD-10-CM

## 2024-04-25 DIAGNOSIS — Z6835 Body mass index (BMI) 35.0-35.9, adult: Secondary | ICD-10-CM | POA: Diagnosis not present

## 2024-04-25 DIAGNOSIS — R7303 Prediabetes: Secondary | ICD-10-CM

## 2024-04-25 DIAGNOSIS — Z6836 Body mass index (BMI) 36.0-36.9, adult: Secondary | ICD-10-CM | POA: Insufficient documentation

## 2024-04-25 DIAGNOSIS — E559 Vitamin D deficiency, unspecified: Secondary | ICD-10-CM | POA: Insufficient documentation

## 2024-04-25 MED ORDER — METFORMIN HCL 500 MG PO TABS: ORAL_TABLET | ORAL | 0 refills | Status: DC

## 2024-04-25 MED ORDER — VITAMIN D (ERGOCALCIFEROL) 1.25 MG (50000 UNIT) PO CAPS: 50000.0000 [IU] | ORAL_CAPSULE | ORAL | 0 refills | Status: DC

## 2024-04-25 NOTE — Progress Notes (Signed)
 Office: [phone]  /  Fax: [phone]  WEIGHT SUMMARY AND BIOMETRICS  Anthropometric Measurements Height: 5' 8 (1.727 m) Weight: 232 lb (105.2 kg) BMI (Calculated): 35.28 Weight at Last Visit: 240 lb Weight Lost Since Last Visit: 8 lb Weight Gained Since Last Visit: 0 Starting Weight: 251 lb Total Weight Loss (lbs): 19 lb (8.618 kg) Peak Weight: 264 lb   Body Composition  Body Fat %: 39.4 % Fat Mass (lbs): 91.4 lbs Muscle Mass (lbs): 133.8 lbs Total Body Water (lbs): 93.4 lbs Visceral Fat Rating : 9   Other Clinical Data RMR: 2002 Fasting: no Labs: no Today's Visit #: 4 Starting Date: 02/23/24    Chief Complaint: OBESITY  History of Present Illness Misty Brown is a 43 year old female who presents for obesity treatment and progress assessment.  She is adhering to a category three obesity treatment plan 95% of the time and has lost eight pounds over the past four weeks. She is working on increasing her physical activity, primarily through walking.  She experiences hunger in the afternoons despite following her meal plan, which includes a sandwich, yogurt, fruit, and green tea or water. She avoids coffee late at night, not due to sleep issues, but out of preference. She typically eats lunch around 11:00 to 11:30 AM and avoids eating too late to ensure she is hungry for dinner. On Sundays, she eats lunch prepared by her mother, focusing on vegetables and meat, avoiding mashed potatoes and macaroni and cheese.  She is currently taking metformin  in the morning, as taking it in the evening caused gastrointestinal issues and increased hunger. No gastrointestinal issues with the current morning regimen. She also takes vitamin D , which she tries to take on Wednesdays but sometimes takes on Saturdays due to refill timing.  She manages her diet by substituting keto bread for regular bread, which she finds too airy, and has reduced her green tea calories from 70 to 5. Her  children are attentive to her diet, often reminding her of her dietary restrictions.      PHYSICAL EXAM:  Blood pressure 126/84, pulse 81, temperature 97.9 F (36.6 C), height 5' 8 (1.727 m), weight 232 lb (105.2 kg), SpO2 98%. Body mass index is 35.28 kg/m.  DIAGNOSTIC DATA REVIEWED:  BMET    Component Value Date/Time   NA 138 02/23/2024 1048   K 4.1 02/23/2024 1048   CL 101 02/23/2024 1048   CO2 19 (L) 02/23/2024 1048   GLUCOSE 91 02/23/2024 1048   GLUCOSE 115 (H) 03/25/2023 1447   BUN 8 02/23/2024 1048   CREATININE 0.62 02/23/2024 1048   CALCIUM 9.3 02/23/2024 1048   GFRNONAA >60 03/26/2011 0533   GFRAA >60 03/26/2011 0533   Lab Results  Component Value Date   HGBA1C 5.7 (H) 02/23/2024   HGBA1C 5.7 02/09/2014   Lab Results  Component Value Date   INSULIN  15.7 02/23/2024   Lab Results  Component Value Date   TSH 1.090 02/23/2024   CBC    Component Value Date/Time   WBC 7.3 02/23/2024 1048   WBC 6.7 02/27/2022 1459   RBC 4.85 02/23/2024 1048   RBC 4.95 02/27/2022 1459   HGB 13.0 02/23/2024 1048   HCT 40.1 02/23/2024 1048   PLT 289 02/23/2024 1048   MCV 83 02/23/2024 1048   MCH 26.8 02/23/2024 1048   MCH 24.4 (L) 03/24/2017 0524   MCHC 32.4 02/23/2024 1048   MCHC 33.3 02/27/2022 1459   RDW 13.1 02/23/2024 1048  Iron Studies No results found for: IRON, TIBC, FERRITIN, IRONPCTSAT Lipid Panel     Component Value Date/Time   CHOL 133 02/23/2024 1048   TRIG 108 02/23/2024 1048   HDL 33 (L) 02/23/2024 1048   CHOLHDL 4.0 02/23/2024 1048   CHOLHDL 4 03/25/2023 1447   VLDL 27.8 03/25/2023 1447   LDLCALC 80 02/23/2024 1048   Hepatic Function Panel     Component Value Date/Time   PROT 7.1 02/23/2024 1048   ALBUMIN 4.2 02/23/2024 1048   AST 22 02/23/2024 1048   ALT 23 02/23/2024 1048   ALKPHOS 70 02/23/2024 1048   BILITOT 0.3 02/23/2024 1048      Component Value Date/Time   TSH 1.090 02/23/2024 1048   Nutritional Lab Results   Component Value Date   VD25OH 26.0 (L) 02/23/2024   VD25OH 35.50 07/26/2019   VD25OH 19.91 (L) 05/03/2019     Assessment and Plan Assessment & Plan Obesity She is adhering to a category three obesity management plan 95% of the time, resulting in an eight-pound weight loss over the last four weeks. Despite following the meal plan, which includes a 450-600 calorie dinner with at least 40 grams of protein, she experiences afternoon hunger. Strategies to manage this include planning a late afternoon snack and redistributing protein intake throughout the day, which may improve adherence and prevent overeating later. - Continue the category three plan. - Plan a late afternoon snack to manage hunger. - Consider redistributing protein intake by consuming part of the dinner protein as an afternoon snack. - Discuss snack options such as beef jerky, hard-boiled eggs, or protein bars.  Prediabetes She is on metformin  for prediabetes. Initially, she experienced gastrointestinal side effects with evening dosing, but tolerates morning dosing well. It was suggested to try taking metformin  with lunch to manage afternoon hunger, as the morning dose may wear off by late afternoon, potentially improving hunger management and blood sugar control. - Continue morning metformin . - Consider taking metformin  with lunch to manage afternoon hunger, especially on weekends.  Vitamin D  deficiency She is on prescription vitamin D , taken on Saturdays, and manages her refills appropriately. - Continue prescription vitamin D  as directed.  Follow-up 3-4 weeks     She was informed of the importance of frequent follow up visits to maximize her success with intensive lifestyle modifications for her multiple health conditions.    Louann Penton, MD

## 2024-05-14 ENCOUNTER — Other Ambulatory Visit: Payer: Self-pay | Admitting: Primary Care

## 2024-05-14 DIAGNOSIS — F32A Depression, unspecified: Secondary | ICD-10-CM

## 2024-05-15 NOTE — Telephone Encounter (Signed)
 Patient is due for CPE/follow up, this will be required prior to any further refills.  Please schedule, thank you!

## 2024-05-17 NOTE — Telephone Encounter (Signed)
 Lvmtcb, sent mychart message

## 2024-05-19 ENCOUNTER — Encounter (INDEPENDENT_AMBULATORY_CARE_PROVIDER_SITE_OTHER): Payer: Self-pay | Admitting: Family Medicine

## 2024-05-19 ENCOUNTER — Ambulatory Visit (INDEPENDENT_AMBULATORY_CARE_PROVIDER_SITE_OTHER): Admitting: Family Medicine

## 2024-05-19 VITALS — BP 116/76 | HR 79 | Temp 98.2°F | Ht 68.0 in | Wt 230.0 lb

## 2024-05-19 DIAGNOSIS — R7303 Prediabetes: Secondary | ICD-10-CM

## 2024-05-19 DIAGNOSIS — M2550 Pain in unspecified joint: Secondary | ICD-10-CM | POA: Diagnosis not present

## 2024-05-19 DIAGNOSIS — M25532 Pain in left wrist: Secondary | ICD-10-CM

## 2024-05-19 DIAGNOSIS — Z6834 Body mass index (BMI) 34.0-34.9, adult: Secondary | ICD-10-CM

## 2024-05-19 DIAGNOSIS — E559 Vitamin D deficiency, unspecified: Secondary | ICD-10-CM | POA: Diagnosis not present

## 2024-05-19 DIAGNOSIS — M25512 Pain in left shoulder: Secondary | ICD-10-CM

## 2024-05-19 DIAGNOSIS — M25511 Pain in right shoulder: Secondary | ICD-10-CM

## 2024-05-19 DIAGNOSIS — E669 Obesity, unspecified: Secondary | ICD-10-CM

## 2024-05-19 DIAGNOSIS — Z6835 Body mass index (BMI) 35.0-35.9, adult: Secondary | ICD-10-CM

## 2024-05-19 DIAGNOSIS — M25521 Pain in right elbow: Secondary | ICD-10-CM

## 2024-05-19 DIAGNOSIS — M25531 Pain in right wrist: Secondary | ICD-10-CM

## 2024-05-19 DIAGNOSIS — M25522 Pain in left elbow: Secondary | ICD-10-CM

## 2024-05-19 MED ORDER — METFORMIN HCL 500 MG PO TABS: 500.0000 mg | ORAL_TABLET | Freq: Two times a day (BID) | ORAL | 0 refills | Status: DC

## 2024-05-19 MED ORDER — VITAMIN D (ERGOCALCIFEROL) 1.25 MG (50000 UNIT) PO CAPS: 50000.0000 [IU] | ORAL_CAPSULE | ORAL | 0 refills | Status: DC

## 2024-05-19 NOTE — Progress Notes (Signed)
 Office: [phone]  /  Fax: [phone]  WEIGHT SUMMARY AND BIOMETRICS  Anthropometric Measurements Height: 5' 8 (1.727 m) Weight: 230 lb (104.3 kg) BMI (Calculated): 34.98 Weight at Last Visit: 232 lb Weight Lost Since Last Visit: 2 lb Weight Gained Since Last Visit: 0 Starting Weight: 251 lb Total Weight Loss (lbs): 21 lb (9.526 kg) Peak Weight: 264 lb   Body Composition  Body Fat %: 42.7 % Fat Mass (lbs): 98.6 lbs Muscle Mass (lbs): 125.6 lbs Total Body Water (lbs): 93.6 lbs Visceral Fat Rating : 10   Other Clinical Data RMR: 2002 Fasting: no Labs: no Today's Visit #: 5 Starting Date: 02/23/24    Chief Complaint: OBESITY   History of Present Illness Misty Brown is a 43 year old female with obesity who presents for a follow-up on her obesity treatment plan and progress assessment.  She is adhering to a category one eating plan 90% of the time and has lost two pounds in the last three weeks. She engages in physical activity by renovating her house and walking for 15 minutes three to four times a week.  She experiences hunger primarily in the afternoon and evening. Her stress levels are elevated due to ongoing home renovations and managing her children's activities. She reports muscle strain from physical activities related to renovations and straining her back, possibly from using heated car seats.  She reports joint pain and body aches over the past four to five days, affecting all her joints, reminiscent of previous issues. She has a history of hip problems and notes pain across her back near the kidneys.  She is currently taking metformin  and vitamin D . She switched her breakfast to toast with peanut butter and sugar-free jelly, but is unsure if this change is related to her recent joint pain. No known allergies to peanuts. She takes metformin  in the morning and reports no issues with vitamin D  supplementation. She encountered a pharmacy issue with her  vitamin D  prescription but managed to resolve it.      PHYSICAL EXAM:  Blood pressure 116/76, pulse 79, temperature 98.2 F (36.8 C), height 5' 8 (1.727 m), weight 230 lb (104.3 kg), SpO2 97%. Body mass index is 34.97 kg/m.  DIAGNOSTIC DATA REVIEWED:  BMET    Component Value Date/Time   NA 138 02/23/2024 1048   K 4.1 02/23/2024 1048   CL 101 02/23/2024 1048   CO2 19 (L) 02/23/2024 1048   GLUCOSE 91 02/23/2024 1048   GLUCOSE 115 (H) 03/25/2023 1447   BUN 8 02/23/2024 1048   CREATININE 0.62 02/23/2024 1048   CALCIUM 9.3 02/23/2024 1048   GFRNONAA >60 03/26/2011 0533   GFRAA >60 03/26/2011 0533   Lab Results  Component Value Date   HGBA1C 5.7 (H) 02/23/2024   HGBA1C 5.7 02/09/2014   Lab Results  Component Value Date   INSULIN  15.7 02/23/2024   Lab Results  Component Value Date   TSH 1.090 02/23/2024   CBC    Component Value Date/Time   WBC 7.3 02/23/2024 1048   WBC 6.7 02/27/2022 1459   RBC 4.85 02/23/2024 1048   RBC 4.95 02/27/2022 1459   HGB 13.0 02/23/2024 1048   HCT 40.1 02/23/2024 1048   PLT 289 02/23/2024 1048   MCV 83 02/23/2024 1048   MCH 26.8 02/23/2024 1048   MCH 24.4 (L) 03/24/2017 0524   MCHC 32.4 02/23/2024 1048   MCHC 33.3 02/27/2022 1459   RDW 13.1 02/23/2024 1048   Iron Studies No results  found for: IRON, TIBC, FERRITIN, IRONPCTSAT Lipid Panel     Component Value Date/Time   CHOL 133 02/23/2024 1048   TRIG 108 02/23/2024 1048   HDL 33 (L) 02/23/2024 1048   CHOLHDL 4.0 02/23/2024 1048   CHOLHDL 4 03/25/2023 1447   VLDL 27.8 03/25/2023 1447   LDLCALC 80 02/23/2024 1048   Hepatic Function Panel     Component Value Date/Time   PROT 7.1 02/23/2024 1048   ALBUMIN 4.2 02/23/2024 1048   AST 22 02/23/2024 1048   ALT 23 02/23/2024 1048   ALKPHOS 70 02/23/2024 1048   BILITOT 0.3 02/23/2024 1048      Component Value Date/Time   TSH 1.090 02/23/2024 1048   Nutritional Lab Results  Component Value Date   VD25OH 26.0 (L)  02/23/2024   VD25OH 35.50 07/26/2019   VD25OH 19.91 (L) 05/03/2019     Assessment and Plan Assessment & Plan Obesity Obesity management is ongoing with adherence to the category one eating plan 90% of the time. Engaging in physical activity through house renovations and walking 15 minutes 3-4 times a week. Weight loss of 2 pounds in the last three weeks. Hunger persists, particularly in the afternoon and evening. - Continue category one eating plan - Encourage physical activity - Address hunger issues in the afternoon and evening  Prediabetes Currently on metformin , previously taken in the morning. Discussed the pharmacokinetics of metformin  and the benefits of twice-daily dosing to maintain therapeutic levels throughout the day. No adverse effects reported from metformin . Explained the timing of doses to maintain consistent drug levels and the importance of taking it with food to avoid gastrointestinal discomfort. - Increase metformin  to 500 mg twice a day - Ensure metformin  is taken with food  Vitamin D  deficiency Vitamin D  supplementation is ongoing. No issues reported with current regimen. - Refill vitamin D  prescription  Musculoskeletal pain of bilateral wrists, elbows and shoulders Experiencing musculoskeletal pain, possibly related to physical activity and stress from house renovations. Pain is generalized, affecting multiple joints, and reminiscent of previous episodes. No clear dietary or allergic cause identified. Discussed the potential for overuse and the importance of rest to allow muscle recovery. - Use ibuprofen  as needed for pain - Use heating pad as needed for pain -change to egg breakfast to see if the added carbs from the other breakfast option is causing inflammation, will follow   She was informed of the importance of frequent follow up visits to maximize her success with intensive lifestyle modifications for her multiple health conditions.    Louann Penton,  MD

## 2024-05-20 ENCOUNTER — Encounter (INDEPENDENT_AMBULATORY_CARE_PROVIDER_SITE_OTHER): Payer: Self-pay | Admitting: Family Medicine

## 2024-06-03 ENCOUNTER — Encounter: Admitting: Primary Care

## 2024-06-09 ENCOUNTER — Ambulatory Visit (INDEPENDENT_AMBULATORY_CARE_PROVIDER_SITE_OTHER): Admitting: Family Medicine

## 2024-06-14 ENCOUNTER — Other Ambulatory Visit (INDEPENDENT_AMBULATORY_CARE_PROVIDER_SITE_OTHER): Payer: Self-pay | Admitting: Family Medicine

## 2024-06-30 ENCOUNTER — Ambulatory Visit (INDEPENDENT_AMBULATORY_CARE_PROVIDER_SITE_OTHER): Admitting: Family Medicine

## 2024-07-09 ENCOUNTER — Other Ambulatory Visit: Payer: Self-pay | Admitting: Primary Care

## 2024-07-09 DIAGNOSIS — I10 Essential (primary) hypertension: Secondary | ICD-10-CM

## 2024-07-15 ENCOUNTER — Encounter: Admitting: Primary Care

## 2024-07-18 ENCOUNTER — Encounter: Payer: Self-pay | Admitting: Primary Care

## 2024-07-18 ENCOUNTER — Ambulatory Visit (INDEPENDENT_AMBULATORY_CARE_PROVIDER_SITE_OTHER): Admitting: Primary Care

## 2024-07-18 VITALS — BP 118/74 | HR 80 | Temp 97.6°F | Ht 68.0 in | Wt 237.0 lb

## 2024-07-18 DIAGNOSIS — F32A Depression, unspecified: Secondary | ICD-10-CM

## 2024-07-18 DIAGNOSIS — K219 Gastro-esophageal reflux disease without esophagitis: Secondary | ICD-10-CM | POA: Diagnosis not present

## 2024-07-18 DIAGNOSIS — Z0000 Encounter for general adult medical examination without abnormal findings: Secondary | ICD-10-CM

## 2024-07-18 DIAGNOSIS — R7303 Prediabetes: Secondary | ICD-10-CM

## 2024-07-18 DIAGNOSIS — R202 Paresthesia of skin: Secondary | ICD-10-CM | POA: Insufficient documentation

## 2024-07-18 DIAGNOSIS — Z1231 Encounter for screening mammogram for malignant neoplasm of breast: Secondary | ICD-10-CM

## 2024-07-18 DIAGNOSIS — I10 Essential (primary) hypertension: Secondary | ICD-10-CM | POA: Diagnosis not present

## 2024-07-18 DIAGNOSIS — F419 Anxiety disorder, unspecified: Secondary | ICD-10-CM

## 2024-07-18 NOTE — Assessment & Plan Note (Signed)
 Immunizations UTD. Declines influenza vaccine.  Pap smear due, I offered to complete this today and she kindly declined.  She will find a gynecologist. Mammogram due, orders placed.  Discussed the importance of a healthy diet and regular exercise in order for weight loss, and to reduce the risk of further co-morbidity.  Exam stable. Labs reviewed   Follow up in 1 year for repeat physical.

## 2024-07-18 NOTE — Assessment & Plan Note (Addendum)
 Controlled.  Continue hydrochlorothiazide  12.5 mg daily. CMP reviewed from May 2025.

## 2024-07-18 NOTE — Assessment & Plan Note (Addendum)
 Reviewed A1C from May 2025  Continue metformin  500 mg BID.

## 2024-07-18 NOTE — Patient Instructions (Addendum)
 [facility].   Complete your pap smear.  Schedule a visit with Dr. Watt for your numbness..  It was a pleasure to see you today!

## 2024-07-18 NOTE — Assessment & Plan Note (Signed)
 Differentials mostly representative of carpal tunnel syndrome, discussed this with patient today. She may also have a touch of early osteoarthritis from repetitive movement use along.  We discussed options. She will consider scheduling an appointment with sports medicine for evaluation.

## 2024-07-18 NOTE — Assessment & Plan Note (Signed)
 Controlled.  No concerns today.

## 2024-07-18 NOTE — Assessment & Plan Note (Signed)
 Controlled.  Continue fluoxetine 20 mg daily.

## 2024-07-18 NOTE — Progress Notes (Signed)
 Subjective:    Patient ID: Misty Brown, female    DOB: 24-Nov-1980, 43 y.o.   MRN: [id number]  Misty Brown is a very pleasant 43 y.o. female who presents today for complete physical and follow up of chronic conditions.  She would also like to discuss paresthesias. Chronic to the bilateral hands and wrists for about 1 year. More recently she's noticed right wrist pain. Yesterday she attempted to open a door yesterday she noticed intense pain to the posterior wrist. She will take Aleve on occasion with improvement to joints. She has not tried wrist splints/braces. 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   Immunizations: -Tetanus: Completed in 2016 -Influenza: Declines influenza vaccine.   Diet: Fair diet.  Exercise: Regular exercise.  Eye exam: Completes annually  Dental exam: Completes semi-annually    Pap Smear: Completed in 2021 Mammogram: Completed in July 2024  BP Readings from Last 3 Encounters:  07/18/24 118/74  05/19/24 116/76  04/25/24 126/84   Wt Readings from Last 3 Encounters:  07/18/24 237 lb (107.5 kg)  05/19/24 230 lb (104.3 kg)  04/25/24 232 lb (105.2 kg)       Review of Systems  Constitutional:  Negative for unexpected weight change.  HENT:  Negative for rhinorrhea.   Respiratory:  Negative for cough and shortness of breath.   Cardiovascular:  Negative for chest pain.  Gastrointestinal:  Negative for constipation and diarrhea.  Genitourinary:  Negative for difficulty urinating and menstrual problem.  Musculoskeletal:  Positive for arthralgias. Negative for myalgias.  Skin:  Negative for rash.  Allergic/Immunologic: Negative for environmental allergies.  Neurological:  Positive for numbness. Negative for dizziness and headaches.  Psychiatric/Behavioral:  The patient is not nervous/anxious.          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   3 children.   Works in [employer].   Enjoys spending time with her family.   Social Drivers of Corporate investment banker Strain: Low Risk  (07/17/2024)   Overall Financial Resource Strain (CARDIA)    Difficulty of Paying Living Expenses: Not hard at all  Food Insecurity: No Food Insecurity (07/17/2024)   Hunger Vital Sign    Worried About Running Out of Food  in the Last Year: Never true    Ran Out of Food in the Last Year: Never true  Transportation Needs: No Transportation Needs (07/17/2024)   PRAPARE - Administrator, Civil Service (Medical): No    Lack of Transportation (Non-Medical): No  Physical Activity: Sufficiently Active (07/17/2024)   Exercise Vital Sign    Days of Exercise per Week: 7 days    Minutes of Exercise per Session: 30 min  Stress: No Stress Concern Present (07/17/2024)   [employer] of Occupational Health - Occupational Stress Questionnaire    Feeling of Stress: Not at all  Social Connections: Moderately Integrated  (07/17/2024)   Social Connection and Isolation Panel    Frequency of Communication with Friends and Family: More than three times a week    Frequency of Social Gatherings with Friends and Family: Once a week    Attends Religious Services: 1 to 4 times per year    Active Member of [employer] or Organizations: No    Attends Engineer, structural: Not on file    Marital Status: Married  Intimate Partner Violence: Unknown (01/03/2022)   Received from [facility]   HITS    Physically Hurt: Not on file    Insult or Talk Down To: Not on file    Threaten Physical Harm: Not on file    Scream or Curse: Not on file    Past Surgical History:  Procedure Laterality Date   TUBAL LIGATION N/A 03/23/2017   Procedure: POST PARTUM TUBAL LIGATION;  Surgeon: Bettina Muskrat, MD;  Location: [facility];  Service: Gynecology;  Laterality: N/A;   WISDOM TOOTH EXTRACTION      Family History  Problem Relation Age of Onset   Hypertension Mother    Hyperlipidemia Mother    Heart murmur Mother    Heart disease Maternal Grandmother    Heart disease Maternal Grandfather    Stroke Paternal Grandmother     Allergies  Allergen Reactions   Dicyclomine     Abdominal pain    Doxycycline Nausea And Vomiting    Current Outpatient Medications on File Prior to Visit  Medication Sig Dispense Refill   FLUoxetine  (PROZAC ) 20 MG capsule TAKE 1 CAPSULE BY MOUTH EVERY DAY FOR ANXIETY 90 capsule 0   hydrochlorothiazide  (HYDRODIURIL ) 12.5 MG tablet TAKE 1 TABLET (12.5 MG TOTAL) BY MOUTH DAILY FOR BLOOD PRESSURE 90 tablet 0   metFORMIN  (GLUCOPHAGE ) 500 MG tablet Take 1 tablet (500 mg total) by mouth 2 (two) times daily with a meal. 60 tablet 0   Vitamin D , Ergocalciferol , (DRISDOL ) 1.25 MG (50000 UNIT) CAPS capsule Take 1 capsule (50,000 Units total) by mouth every 7 (seven) days. 5 capsule 0   No current facility-administered medications on file prior to visit.    BP 118/74   Pulse 80   Temp 97.6 F (36.4 C)  (Temporal)   Ht 5' 8 (1.727 m)   Wt 237 lb (107.5 kg)   SpO2 97%   BMI 36.04 kg/m  Objective:   Physical Exam HENT:     Right Ear: Tympanic membrane and ear canal normal.     Left Ear: Tympanic membrane and ear canal normal.  Eyes:     Pupils: Pupils are equal, round, and reactive to light.  Cardiovascular:     Rate and Rhythm: Normal rate and regular rhythm.  Pulmonary:     Effort: Pulmonary effort is normal.     Breath sounds: Normal breath sounds.  Abdominal:  General: Bowel sounds are normal.     Palpations: Abdomen is soft.     Tenderness: There is no abdominal tenderness.  Musculoskeletal:        General: Normal range of motion.     Cervical back: Neck supple.  Skin:    General: Skin is warm and dry.  Neurological:     Mental Status: She is alert and oriented to person, place, and time.     Cranial Nerves: No cranial nerve deficit.     Deep Tendon Reflexes:     Reflex Scores:      Patellar reflexes are 2+ on the right side and 2+ on the left side. Psychiatric:        Mood and Affect: Mood normal.     Physical Exam        Assessment & Plan:  Preventative health care Assessment & Plan: Immunizations UTD. Declines influenza vaccine.  Pap smear due, I offered to complete this today and she kindly declined.  She will find a gynecologist. Mammogram due, orders placed.  Discussed the importance of a healthy diet and regular exercise in order for weight loss, and to reduce the risk of further co-morbidity.  Exam stable. Labs reviewed   Follow up in 1 year for repeat physical.    Essential hypertension Assessment & Plan: Controlled.  Continue hydrochlorothiazide  12.5 mg daily. CMP reviewed from May 2025.   Gastroesophageal reflux disease, unspecified whether esophagitis present Assessment & Plan: Controlled.  No concerns today.   Prediabetes Assessment & Plan: Reviewed A1C from May 2025  Continue metformin  500 mg BID.    Screening  mammogram for breast cancer -     3D Screening Mammogram, Left and Right; Future  Paresthesias Assessment & Plan: Differentials mostly representative of carpal tunnel syndrome, discussed this with patient today. She may also have a touch of early osteoarthritis from repetitive movement use along.  We discussed options. She will consider scheduling an appointment with sports medicine for evaluation.   Anxiety and depression Assessment & Plan: Controlled.  Continue fluoxetine  20 mg daily     Assessment and Plan Assessment & Plan         Comer MARLA Gaskins, NP     History of Present Illness

## 2024-07-20 ENCOUNTER — Ambulatory Visit (INDEPENDENT_AMBULATORY_CARE_PROVIDER_SITE_OTHER): Admitting: Family Medicine

## 2024-07-20 ENCOUNTER — Encounter (INDEPENDENT_AMBULATORY_CARE_PROVIDER_SITE_OTHER): Payer: Self-pay | Admitting: Family Medicine

## 2024-07-20 VITALS — BP 123/79 | HR 87 | Temp 98.0°F | Ht 68.0 in | Wt 232.0 lb

## 2024-07-20 DIAGNOSIS — Z6835 Body mass index (BMI) 35.0-35.9, adult: Secondary | ICD-10-CM | POA: Diagnosis not present

## 2024-07-20 DIAGNOSIS — Z6838 Body mass index (BMI) 38.0-38.9, adult: Secondary | ICD-10-CM

## 2024-07-20 DIAGNOSIS — Z6834 Body mass index (BMI) 34.0-34.9, adult: Secondary | ICD-10-CM

## 2024-07-20 DIAGNOSIS — R7303 Prediabetes: Secondary | ICD-10-CM

## 2024-07-20 DIAGNOSIS — E66812 Obesity, class 2: Secondary | ICD-10-CM | POA: Diagnosis not present

## 2024-07-20 DIAGNOSIS — E559 Vitamin D deficiency, unspecified: Secondary | ICD-10-CM

## 2024-07-20 MED ORDER — VITAMIN D (ERGOCALCIFEROL) 1.25 MG (50000 UNIT) PO CAPS: 50000.0000 [IU] | ORAL_CAPSULE | ORAL | 0 refills | Status: DC

## 2024-07-20 MED ORDER — METFORMIN HCL 500 MG PO TABS: 500.0000 mg | ORAL_TABLET | Freq: Three times a day (TID) | ORAL | 0 refills | Status: AC

## 2024-07-20 NOTE — Progress Notes (Signed)
 Office: [phone]  /  Fax: [phone]  WEIGHT SUMMARY AND BIOMETRICS  Anthropometric Measurements Height: 5' 8 (1.727 m) Weight: 232 lb (105.2 kg) BMI (Calculated): 35.28 Weight at Last Visit: 230 lb Weight Lost Since Last Visit: 0 Weight Gained Since Last Visit: 2 lb Starting Weight: 251 lb Total Weight Loss (lbs): 19 lb (8.618 kg)   Body Composition  Body Fat %: 42.7 % Fat Mass (lbs): 99.2 lbs Muscle Mass (lbs): 126.4 lbs Total Body Water (lbs): 92 lbs Visceral Fat Rating : 10   Other Clinical Data Fasting: no Labs: no Today's Visit #: 6 Starting Date: 02/23/24    Chief Complaint: OBESITY    History of Present Illness Misty Brown is a 43 year old female with obesity who presents for a follow-up on her treatment plan and progress.  She follows her eating plan approximately 85% of the time, although she is unsure of the specifics. She attempts to track her calorie intake and meet her protein and vegetable goals. Despite these efforts, she has gained two pounds over the last two months. She tries to prepare meals in advance, such as making an egg casserole to last the week, and is mindful of her food choices, avoiding emotional eating and high-calorie foods like cookies and fair food.  Her physical activity includes walking for 15 minutes three times a week. She feels like she is retaining fluid. She was recently sick with a head cold and congestion, which affected her eating habits, as she did not feel like eating or drinking much during that time. She consumed ginger ale, specifically a blackberry flavor, which she feels helped her feel better.  She is currently taking metformin  for her prediabetes, with both doses being taken as prescribed. She is also on ergocalciferol  for vitamin D  deficiency, taking 50,000 IU per week.  Her social history includes a busy schedule with her children's activities, particularly her daughter's cheerleading, which involves  frequent travel and late nights. This hectic schedule impacts her ability to maintain a consistent routine for her health goals.      PHYSICAL EXAM:  Blood pressure 123/79, pulse 87, temperature 98 F (36.7 C), height 5' 8 (1.727 m), weight 232 lb (105.2 kg), SpO2 98%. Body mass index is 35.28 kg/m.  DIAGNOSTIC DATA REVIEWED:  BMET    Component Value Date/Time   NA 138 02/23/2024 1048   K 4.1 02/23/2024 1048   CL 101 02/23/2024 1048   CO2 19 (L) 02/23/2024 1048   GLUCOSE 91 02/23/2024 1048   GLUCOSE 115 (H) 03/25/2023 1447   BUN 8 02/23/2024 1048   CREATININE 0.62 02/23/2024 1048   CALCIUM 9.3 02/23/2024 1048   GFRNONAA >60 03/26/2011 0533   GFRAA >60 03/26/2011 0533   Lab Results  Component Value Date   HGBA1C 5.7 (H) 02/23/2024   HGBA1C 5.7 02/09/2014   Lab Results  Component Value Date   INSULIN  15.7 02/23/2024   Lab Results  Component Value Date   TSH 1.090 02/23/2024   CBC    Component Value Date/Time   WBC 7.3 02/23/2024 1048   WBC 6.7 02/27/2022 1459   RBC 4.85 02/23/2024 1048   RBC 4.95 02/27/2022 1459   HGB 13.0 02/23/2024 1048   HCT 40.1 02/23/2024 1048   PLT 289 02/23/2024 1048   MCV 83 02/23/2024 1048   MCH 26.8 02/23/2024 1048   MCH 24.4 (L) 03/24/2017 0524   MCHC 32.4 02/23/2024 1048   MCHC 33.3 02/27/2022 1459   RDW 13.1  02/23/2024 1048   Iron Studies No results found for: IRON, TIBC, FERRITIN, IRONPCTSAT Lipid Panel     Component Value Date/Time   CHOL 133 02/23/2024 1048   TRIG 108 02/23/2024 1048   HDL 33 (L) 02/23/2024 1048   CHOLHDL 4.0 02/23/2024 1048   CHOLHDL 4 03/25/2023 1447   VLDL 27.8 03/25/2023 1447   LDLCALC 80 02/23/2024 1048   Hepatic Function Panel     Component Value Date/Time   PROT 7.1 02/23/2024 1048   ALBUMIN 4.2 02/23/2024 1048   AST 22 02/23/2024 1048   ALT 23 02/23/2024 1048   ALKPHOS 70 02/23/2024 1048   BILITOT 0.3 02/23/2024 1048      Component Value Date/Time   TSH 1.090 02/23/2024  1048   Nutritional Lab Results  Component Value Date   VD25OH 26.0 (L) 02/23/2024   VD25OH 35.50 07/26/2019   VD25OH 19.91 (L) 05/03/2019     Assessment and Plan Assessment & Plan Obesity Following dietary plan 85% of the time, tracking calories, and meeting protein and vegetable goals. Engaging in 15 minutes of walking three times a week. Recent two-pound weight gain likely due to fluid retention and illness affecting eating habits. Emotional eating and lack of self-care time due to a busy schedule may contribute. - Encourage meal planning and preparation to ensure nutritional goals are met - Address emotional eating and find time for self-care  Vitamin D  deficiency Currently treated with ergocalciferol  50,000 IU per week. - Continue ergocalciferol  50,000 IU weekly - Monitor vitamin D  levels  Prediabetes On metformin  for prediabetes, working on diet, exercise, and weight loss. Currently taking metformin  twice a day and compliant with medication. Suggest increasing metformin  dose to manage blood sugar levels, especially later in the day. - Increase metformin  to 500 mg three times a day with meals - Check labs at the next appointment on August 11, 2024     Misty Brown was informed of the importance of frequent follow up visits to maximize her success with intensive lifestyle modifications for her obesity and obesity related health conditions as recommended by USPSTF and CMS guidelines   Louann Penton, MD

## 2024-08-11 ENCOUNTER — Ambulatory Visit (INDEPENDENT_AMBULATORY_CARE_PROVIDER_SITE_OTHER): Payer: Self-pay | Admitting: Family Medicine

## 2024-08-11 ENCOUNTER — Encounter (INDEPENDENT_AMBULATORY_CARE_PROVIDER_SITE_OTHER): Payer: Self-pay | Admitting: Family Medicine

## 2024-08-11 VITALS — BP 114/74 | HR 67 | Temp 98.0°F | Ht 68.0 in | Wt 232.0 lb

## 2024-08-11 DIAGNOSIS — Z6835 Body mass index (BMI) 35.0-35.9, adult: Secondary | ICD-10-CM

## 2024-08-11 DIAGNOSIS — E669 Obesity, unspecified: Secondary | ICD-10-CM

## 2024-08-11 DIAGNOSIS — R7303 Prediabetes: Secondary | ICD-10-CM | POA: Diagnosis not present

## 2024-08-11 DIAGNOSIS — E786 Lipoprotein deficiency: Secondary | ICD-10-CM

## 2024-08-11 DIAGNOSIS — E559 Vitamin D deficiency, unspecified: Secondary | ICD-10-CM

## 2024-08-11 MED ORDER — VITAMIN D (ERGOCALCIFEROL) 1.25 MG (50000 UNIT) PO CAPS: 50000.0000 [IU] | ORAL_CAPSULE | ORAL | 0 refills | Status: AC

## 2024-08-11 NOTE — Progress Notes (Signed)
 Office: [phone]  /  Fax: [phone]  WEIGHT SUMMARY AND BIOMETRICS  Anthropometric Measurements Height: 5' 8 (1.727 m) Weight: 232 lb (105.2 kg) BMI (Calculated): 35.28 Weight at Last Visit: 232 lb Weight Lost Since Last Visit: 0 Weight Gained Since Last Visit: 0 Starting Weight: 251 lb Total Weight Loss (lbs): 19 lb (8.618 kg) Peak Weight: 264 lb   Body Composition  Body Fat %: 42.6 % Fat Mass (lbs): 99 lbs Muscle Mass (lbs): 126.6 lbs Total Body Water (lbs): 90.6 lbs Visceral Fat Rating : 10   Other Clinical Data Fasting: yes Labs: no Today's Visit #: 7 Starting Date: 02/23/24    Chief Complaint: OBESITY   History of Present Illness Misty Brown is a 43 year old female who presents for obesity treatment and progress assessment.  She is following a low-carb eating plan 90% of the time and engages in walking for exercise 15 minutes, 2 times a week. She has maintained her weight over the last month. She is motivated to lose weight to fit into a vintage quilt jacket and has family gatherings planned for Thanksgiving, where she intends to manage her food intake by limiting portion sizes and avoiding high calorie leftovers.  She is being treated for vitamin D  deficiency with prescription ergocalciferol , 50,000 units weekly. Her most recent vitamin D  level was low at 26 ng/mL in May 2025, and she is due for lab rechecks soon. She requested a refill of her prescription.  She is also being treated for prediabetes with metformin  500 mg per day. Her last A1c was 5.7% in May 2025. She has been working on diet, exercise, and weight loss and is due for lab rechecks. She experienced stomach discomfort when increasing metformin  to three times a day, which subsided after reducing the dose to once a day and increasing protein intake.  She has a diagnosis of low HDL with normal triglycerides and LDL.      PHYSICAL EXAM:  Blood pressure 114/74, pulse 67, temperature 98  F (36.7 C), height 5' 8 (1.727 m), weight 232 lb (105.2 kg), SpO2 99%. Body mass index is 35.28 kg/m.  DIAGNOSTIC DATA REVIEWED:  BMET    Component Value Date/Time   NA 138 02/23/2024 1048   K 4.1 02/23/2024 1048   CL 101 02/23/2024 1048   CO2 19 (L) 02/23/2024 1048   GLUCOSE 91 02/23/2024 1048   GLUCOSE 115 (H) 03/25/2023 1447   BUN 8 02/23/2024 1048   CREATININE 0.62 02/23/2024 1048   CALCIUM 9.3 02/23/2024 1048   GFRNONAA >60 03/26/2011 0533   GFRAA >60 03/26/2011 0533   Lab Results  Component Value Date   HGBA1C 5.7 (H) 02/23/2024   HGBA1C 5.7 02/09/2014   Lab Results  Component Value Date   INSULIN  15.7 02/23/2024   Lab Results  Component Value Date   TSH 1.090 02/23/2024   CBC    Component Value Date/Time   WBC 7.3 02/23/2024 1048   WBC 6.7 02/27/2022 1459   RBC 4.85 02/23/2024 1048   RBC 4.95 02/27/2022 1459   HGB 13.0 02/23/2024 1048   HCT 40.1 02/23/2024 1048   PLT 289 02/23/2024 1048   MCV 83 02/23/2024 1048   MCH 26.8 02/23/2024 1048   MCH 24.4 (L) 03/24/2017 0524   MCHC 32.4 02/23/2024 1048   MCHC 33.3 02/27/2022 1459   RDW 13.1 02/23/2024 1048   Iron Studies No results found for: IRON, TIBC, FERRITIN, IRONPCTSAT Lipid Panel     Component Value Date/Time  CHOL 133 02/23/2024 1048   TRIG 108 02/23/2024 1048   HDL 33 (L) 02/23/2024 1048   CHOLHDL 4.0 02/23/2024 1048   CHOLHDL 4 03/25/2023 1447   VLDL 27.8 03/25/2023 1447   LDLCALC 80 02/23/2024 1048   Hepatic Function Panel     Component Value Date/Time   PROT 7.1 02/23/2024 1048   ALBUMIN 4.2 02/23/2024 1048   AST 22 02/23/2024 1048   ALT 23 02/23/2024 1048   ALKPHOS 70 02/23/2024 1048   BILITOT 0.3 02/23/2024 1048      Component Value Date/Time   TSH 1.090 02/23/2024 1048   Nutritional Lab Results  Component Value Date   VD25OH 26.0 (L) 02/23/2024   VD25OH 35.50 07/26/2019   VD25OH 19.91 (L) 05/03/2019     Assessment and Plan Assessment & Plan Obesity,  adult She is following a low-carb diet 90% of the time and walking for 15 minutes, twice a week. Weight has been maintained over the last month. Discussed potential use of GLP-1 agonists for weight loss, noting significant GI side effects and potential metabolic impact. Emphasized importance of protein intake and strengthening exercises to prevent muscle mass loss. Discussed potential use of weighted vests or ankle weights to enhance exercise regimen. - Continue low-carb diet - Increase walking to three days a week - Consider weighted vests or ankle weights for strengthening - Monitor weight and adjust exercise regimen as needed  Vitamin D  deficiency Managed with ergocalciferol  50,000 units weekly. Recent vitamin D  level was low at 26 in May. Labs are due for recheck. - Continue ergocalciferol  50,000 units weekly - Ordered vitamin D  level recheck  Prediabetes Managed with metformin  500 mg daily. Last A1c was 5.7 in May. She experienced gastrointestinal discomfort with increased metformin  dose, resolved by reducing to once daily. Discussed potential to increase dose back to twice daily if tolerated. Emphasized importance of monitoring carbohydrate intake, especially during holidays. - Continue metformin  500 mg once daily - Monitor carbohydrate intake, especially during holidays - Will consider increasing metformin  to twice daily if tolerated  Low HDL cholesterol Normal triglycerides and LDL. She is increasing physical activity to improve HDL levels. Labs are due for recheck. - Continue increasing physical activity - Ordered lipid panel recheck    Misty Brown was counseled on the importance of maintaining healthy lifestyle habits, including balanced nutrition, regular physical activity, and behavioral modifications, while taking antiobesity medication.  Patient verbalized understanding that medication is an adjunct to, not a replacement for, lifestyle changes and that the long-term success and  weight maintenance depend on continued adherence to these strategies.   Misty Brown was informed of the importance of frequent follow up visits to maximize her success with intensive lifestyle modifications for her obesity and obesity related health conditions as recommended by USPSTF and CMS guidelines   Louann Penton, MD

## 2024-08-12 ENCOUNTER — Other Ambulatory Visit (INDEPENDENT_AMBULATORY_CARE_PROVIDER_SITE_OTHER): Payer: Self-pay | Admitting: Family Medicine

## 2024-08-12 LAB — CMP14+EGFR
ALT: 20 IU/L (ref 0–32)
AST: 20 IU/L (ref 0–40)
Albumin: 4.3 g/dL (ref 3.9–4.9)
Alkaline Phosphatase: 60 IU/L (ref 41–116)
BUN/Creatinine Ratio: 16 (ref 9–23)
BUN: 10 mg/dL (ref 6–24)
Bilirubin Total: 0.3 mg/dL (ref 0.0–1.2)
CO2: 25 mmol/L (ref 20–29)
Calcium: 9.6 mg/dL (ref 8.7–10.2)
Chloride: 101 mmol/L (ref 96–106)
Creatinine, Ser: 0.64 mg/dL (ref 0.57–1.00)
Globulin, Total: 2.6 g/dL (ref 1.5–4.5)
Glucose: 90 mg/dL (ref 70–99)
Potassium: 4.1 mmol/L (ref 3.5–5.2)
Sodium: 139 mmol/L (ref 134–144)
Total Protein: 6.9 g/dL (ref 6.0–8.5)
eGFR: 113 mL/min/1.73 (ref >59)

## 2024-08-12 LAB — LIPID PANEL WITH LDL/HDL RATIO
Cholesterol, Total: 161 mg/dL (ref 100–199)
HDL: 46 mg/dL (ref >39)
LDL Chol Calc (NIH): 97 mg/dL (ref 0–99)
LDL/HDL Ratio: 2.1 ratio (ref 0.0–3.2)
Triglycerides: 96 mg/dL (ref 0–149)
VLDL Cholesterol Cal: 18 mg/dL (ref 5–40)

## 2024-08-12 LAB — VITAMIN D 25 HYDROXY (VIT D DEFICIENCY, FRACTURES): Vit D, 25-Hydroxy: 41.6 ng/mL (ref 30.0–100.0)

## 2024-08-12 LAB — VITAMIN B12: Vitamin B-12: 1129 pg/mL (ref 232–1245)

## 2024-08-16 ENCOUNTER — Other Ambulatory Visit (INDEPENDENT_AMBULATORY_CARE_PROVIDER_SITE_OTHER): Payer: Self-pay | Admitting: Family Medicine

## 2024-08-17 ENCOUNTER — Other Ambulatory Visit: Payer: Self-pay | Admitting: Primary Care

## 2024-08-17 DIAGNOSIS — F419 Anxiety disorder, unspecified: Secondary | ICD-10-CM

## 2024-08-29 ENCOUNTER — Encounter (INDEPENDENT_AMBULATORY_CARE_PROVIDER_SITE_OTHER): Payer: Self-pay | Admitting: Family Medicine

## 2024-08-29 NOTE — Telephone Encounter (Signed)
 LAST APPOINTMENT DATE: 08/11/2024 NEXT APPOINTMENT DATE: was supposed to be tomorrow, but canceled it.  Next appt 10/04/2024   [id number] [place], [place] - [address] [place] [address] Phone: [phone] Fax: [phone]  [facility] [phone] - [address], [place] - [address] [place] [address] Phone: [phone] Fax: [phone]  Patient is requesting a refill of the following medications: Requested Prescriptions    No prescriptions requested or ordered in this encounter    Date last filled: 08/11/2024 Previously prescribed by Dr Verdon  Lab Results  Component Value Date   HGBA1C 5.7 (H) 02/23/2024   HGBA1C 6.0 (A) 07/22/2023   HGBA1C 6.0 03/25/2023   Lab Results  Component Value Date   LDLCALC 97 08/11/2024   CREATININE 0.64 08/11/2024   Lab Results  Component Value Date   VD25OH 41.6 08/11/2024   VD25OH 26.0 (L) 02/23/2024   VD25OH 35.50 07/26/2019    BP Readings from Last 3 Encounters:  08/11/24 114/74  07/20/24 123/79  07/18/24 118/74

## 2024-08-30 ENCOUNTER — Ambulatory Visit (INDEPENDENT_AMBULATORY_CARE_PROVIDER_SITE_OTHER): Payer: Self-pay | Admitting: Family Medicine

## 2024-09-07 ENCOUNTER — Ambulatory Visit

## 2024-10-04 ENCOUNTER — Ambulatory Visit (INDEPENDENT_AMBULATORY_CARE_PROVIDER_SITE_OTHER): Payer: Self-pay | Admitting: Family Medicine

## 2024-10-15 ENCOUNTER — Other Ambulatory Visit: Payer: Self-pay | Admitting: Primary Care

## 2024-10-15 DIAGNOSIS — I10 Essential (primary) hypertension: Secondary | ICD-10-CM

## 2024-10-18 ENCOUNTER — Ambulatory Visit

## 2024-10-22 DIAGNOSIS — Z20828 Contact with and (suspected) exposure to other viral communicable diseases: Secondary | ICD-10-CM

## 2024-10-22 DIAGNOSIS — R52 Pain, unspecified: Secondary | ICD-10-CM

## 2024-10-22 DIAGNOSIS — R6883 Chills (without fever): Secondary | ICD-10-CM

## 2024-10-26 ENCOUNTER — Ambulatory Visit (INDEPENDENT_AMBULATORY_CARE_PROVIDER_SITE_OTHER): Admitting: Family Medicine

## 2024-10-26 MED ORDER — OSELTAMIVIR PHOSPHATE 75 MG PO CAPS: 75.0000 mg | ORAL_CAPSULE | Freq: Two times a day (BID) | ORAL | 0 refills | Status: AC

## 2024-11-01 ENCOUNTER — Ambulatory Visit (INDEPENDENT_AMBULATORY_CARE_PROVIDER_SITE_OTHER): Admitting: Family Medicine

## 2024-11-16 ENCOUNTER — Ambulatory Visit (INDEPENDENT_AMBULATORY_CARE_PROVIDER_SITE_OTHER): Admitting: Family Medicine

## 2024-11-21 ENCOUNTER — Ambulatory Visit

## 2024-11-23 ENCOUNTER — Ambulatory Visit (INDEPENDENT_AMBULATORY_CARE_PROVIDER_SITE_OTHER): Admitting: Family Medicine

## 2025-07-19 ENCOUNTER — Encounter: Admitting: Primary Care
